# Patient Record
Sex: Male | Born: 1960 | Race: Black or African American | Hispanic: No | Marital: Single | Smoking: Current every day smoker
Health system: Southern US, Community
[De-identification: ages and names within clinical notes are randomized; demographics above are authoritative.]

## PROBLEM LIST (undated history)

## (undated) DIAGNOSIS — B1920 Unspecified viral hepatitis C without hepatic coma: Secondary | ICD-10-CM

## (undated) DIAGNOSIS — D509 Iron deficiency anemia, unspecified: Secondary | ICD-10-CM

## (undated) DIAGNOSIS — K6389 Other specified diseases of intestine: Secondary | ICD-10-CM

## (undated) DIAGNOSIS — Z9289 Personal history of other medical treatment: Secondary | ICD-10-CM

## (undated) DIAGNOSIS — C185 Malignant neoplasm of splenic flexure: Secondary | ICD-10-CM

## (undated) HISTORY — PX: OTHER SURGICAL HISTORY: SHX169

---

## 2010-08-28 ENCOUNTER — Emergency Department (HOSPITAL_COMMUNITY)
Admission: EM | Admit: 2010-08-28 | Discharge: 2010-08-28 | Payer: Self-pay | Source: Home / Self Care | Admitting: Emergency Medicine

## 2011-03-25 ENCOUNTER — Emergency Department (HOSPITAL_COMMUNITY)
Admission: EM | Admit: 2011-03-25 | Discharge: 2011-03-25 | Disposition: A | Discharge status: Home / Self Care | Payer: Self-pay | Attending: Emergency Medicine | Admitting: Emergency Medicine

## 2011-03-25 DIAGNOSIS — S335XXA Sprain of ligaments of lumbar spine, initial encounter: Secondary | ICD-10-CM | POA: Insufficient documentation

## 2011-03-25 DIAGNOSIS — M79609 Pain in unspecified limb: Secondary | ICD-10-CM | POA: Insufficient documentation

## 2011-03-25 DIAGNOSIS — Z981 Arthrodesis status: Secondary | ICD-10-CM | POA: Insufficient documentation

## 2011-03-25 DIAGNOSIS — Y92410 Unspecified street and highway as the place of occurrence of the external cause: Secondary | ICD-10-CM | POA: Insufficient documentation

## 2011-03-25 DIAGNOSIS — S139XXA Sprain of joints and ligaments of unspecified parts of neck, initial encounter: Secondary | ICD-10-CM | POA: Insufficient documentation

## 2015-02-05 DIAGNOSIS — D509 Iron deficiency anemia, unspecified: Secondary | ICD-10-CM

## 2015-02-05 HISTORY — DX: Iron deficiency anemia, unspecified: D50.9

## 2015-02-18 ENCOUNTER — Emergency Department (HOSPITAL_COMMUNITY)
Admission: EM | Admit: 2015-02-18 | Discharge: 2015-02-18 | Disposition: A | Discharge status: Home / Self Care | Payer: Self-pay | Attending: Emergency Medicine | Admitting: Emergency Medicine

## 2015-02-18 ENCOUNTER — Encounter (HOSPITAL_COMMUNITY): Payer: Self-pay | Admitting: *Deleted

## 2015-02-18 DIAGNOSIS — Z720 Tobacco use: Secondary | ICD-10-CM | POA: Insufficient documentation

## 2015-02-18 DIAGNOSIS — R109 Unspecified abdominal pain: Secondary | ICD-10-CM | POA: Insufficient documentation

## 2015-02-18 LAB — CBC
HCT: 28.4 % — ABNORMAL LOW (ref 39.0–52.0)
Hemoglobin: 8.9 g/dL — ABNORMAL LOW (ref 13.0–17.0)
MCH: 23.2 pg — ABNORMAL LOW (ref 26.0–34.0)
MCHC: 31.3 g/dL (ref 30.0–36.0)
MCV: 74.2 fL — ABNORMAL LOW (ref 78.0–100.0)
Platelets: 497 10*3/uL — ABNORMAL HIGH (ref 150–400)
RBC: 3.83 MIL/uL — ABNORMAL LOW (ref 4.22–5.81)
RDW: 18.4 % — AB (ref 11.5–15.5)
WBC: 9.4 10*3/uL (ref 4.0–10.5)

## 2015-02-18 LAB — URINALYSIS, ROUTINE W REFLEX MICROSCOPIC
Bilirubin Urine: NEGATIVE
Glucose, UA: NEGATIVE mg/dL
Hgb urine dipstick: NEGATIVE
KETONES UR: NEGATIVE mg/dL
Leukocytes, UA: NEGATIVE
Nitrite: NEGATIVE
PH: 5.5 (ref 5.0–8.0)
Protein, ur: NEGATIVE mg/dL
Specific Gravity, Urine: 1.026 (ref 1.005–1.030)
UROBILINOGEN UA: 0.2 mg/dL (ref 0.0–1.0)

## 2015-02-18 LAB — COMPREHENSIVE METABOLIC PANEL
ALK PHOS: 74 U/L (ref 38–126)
ALT: 15 U/L — AB (ref 17–63)
AST: 17 U/L (ref 15–41)
Albumin: 4.1 g/dL (ref 3.5–5.0)
Anion gap: 8 (ref 5–15)
BUN: 17 mg/dL (ref 6–20)
CALCIUM: 9 mg/dL (ref 8.9–10.3)
CHLORIDE: 105 mmol/L (ref 101–111)
CO2: 25 mmol/L (ref 22–32)
CREATININE: 1.22 mg/dL (ref 0.61–1.24)
GFR calc Af Amer: >60 mL/min (ref >60)
GFR calc non Af Amer: >60 mL/min (ref >60)
Glucose, Bld: 104 mg/dL — ABNORMAL HIGH (ref 65–99)
POTASSIUM: 4.2 mmol/L (ref 3.5–5.1)
Sodium: 138 mmol/L (ref 135–145)
TOTAL PROTEIN: 7.8 g/dL (ref 6.5–8.1)
Total Bilirubin: 0.3 mg/dL (ref 0.3–1.2)

## 2015-02-18 LAB — I-STAT TROPONIN, ED: Troponin i, poc: 0 ng/mL (ref 0.00–0.08)

## 2015-02-18 LAB — LIPASE, BLOOD: Lipase: 20 U/L — ABNORMAL LOW (ref 22–51)

## 2015-02-18 NOTE — ED Provider Notes (Signed)
CSN: [id number]     Arrival date & time 02/18/15  1345 History   First MD Initiated Contact with Patient 02/18/15 1725     Chief Complaint  Patient presents with  . Abdominal Pain     HPI Patient states his had intermittent lower abdominal pain over the past 2 months.  He states it can last for seconds to minutes.  It feels like a radiating pain coming around bilateral flanks.  Currently he is without any symptoms.  He denies dysuria or urinary frequency.  He denies upper abdominal pain.  Denies nausea and vomiting.  He states many times it is relieved by a bowel movement.  He also reports that he awoke this morning with numbness of his bilateral hands that seems to be improving.  He denies weakness of his arms or legs.  No other significant complaints.  He does not have a primary care physician.  He has a multitude of additional minor complaints but is majority of his concerns today are in regards to his abdominal discomfort which is intermittent and his bilateral hand numbness which is improving   History reviewed. No pertinent past medical history. History reviewed. No pertinent past surgical history. History reviewed. No pertinent family history. History  Substance Use Topics  . Smoking status: Current Every Day Smoker -- 0.50 packs/day for 30 years    Types: Cigarettes  . Smokeless tobacco: Not on file  . Alcohol Use: Yes     Comment: socially    Review of Systems  All other systems reviewed and are negative.     Allergies  Review of patient's allergies indicates no known allergies.  Home Medications   Prior to Admission medications   Medication Sig Start Date End Date Taking? Authorizing Provider  OVER THE COUNTER MEDICATION Take 1 tablet by mouth daily as needed. Laxative (type unknown)   Yes Historical Provider, MD   BP 163/105 mmHg  Pulse 90  Temp(Src) 98.5 F (36.9 C) (Oral)  Resp 18  Ht 5\' 4"  (1.626 m)  Wt 224 lb (101.606 kg)  BMI 38.43 kg/m2  SpO2  100% Physical Exam  Constitutional: He is oriented to person, place, and time. He appears well-developed and well-nourished.  HENT:  Head: Normocephalic and atraumatic.  Eyes: EOM are normal.  Neck: Normal range of motion.  Cardiovascular: Normal rate, regular rhythm, normal heart sounds and intact distal pulses.   Pulmonary/Chest: Effort normal and breath sounds normal. No respiratory distress.  Abdominal: Soft. He exhibits no distension. There is no tenderness.  Musculoskeletal: Normal range of motion.  Neurological: He is alert and oriented to person, place, and time.  Skin: Skin is warm and dry.  Psychiatric: He has a normal mood and affect. Judgment normal.  Nursing note and vitals reviewed.   ED Course  Procedures (including critical care time) Labs Review Labs Reviewed  LIPASE, BLOOD - Abnormal; Notable for the following:    Lipase 20 (*)    All other components within normal limits  COMPREHENSIVE METABOLIC PANEL - Abnormal; Notable for the following:    Glucose, Bld 104 (*)    ALT 15 (*)    All other components within normal limits  CBC - Abnormal; Notable for the following:    RBC 3.83 (*)    Hemoglobin 8.9 (*)    HCT 28.4 (*)    MCV 74.2 (*)    MCH 23.2 (*)    RDW 18.4 (*)    Platelets 497 (*)    All  other components within normal limits  URINALYSIS, ROUTINE W REFLEX MICROSCOPIC (NOT AT [facility])  I-STAT TROPOININ, ED    Imaging Review No results found.   EKG Interpretation   Date/Time:  Thursday February 18 2015 14:10:41 EDT Ventricular Rate:  79 PR Interval:  154 QRS Duration: 80 QT Interval:  381 QTC Calculation: 437 R Axis:   70 Text Interpretation:  Sinus rhythm Baseline wander in lead(s) III No old  tracing to compare Confirmed by Illianna Paschal  MD, Lennette Bihari ([id number]) on 02/18/2015  6:01:16 PM      MDM   Final diagnoses:  Abdominal pain, unspecified abdominal location    Overall the patient is well-appearing.  His vital signs are normal.  His abdominal  exam is completely benign.  His blood work and his urine is without significant abnormality except for his anemia with a hemoglobin of 8.9.  We have no prior hemoglobins to compare this to.  He denies bloody or black stools.  He has never had a colonoscopy.  He is not have primary care physician.  I spoke with my his management team who will give him additional resources regarding primary care physicians in the community.  I stressed to him the importance of ongoing workup of his complaints but I do not believe additional workup is required here in the emergency department today.  He does not need advanced imaging or admission to the hospital.  He will [place] need a primary care physician.  I recommended that he return to the ER for any new or worsening symptoms    Jola Schmidt, MD 02/18/15 [phone]

## 2015-02-18 NOTE — Discharge Instructions (Signed)
°Emergency Department Resource Guide °1) Find a Doctor and Pay Out of Pocket °Although you won't have to find out who is covered by your insurance plan, it is a good idea to ask around and get recommendations. You will then need to call the office and see if the doctor you have chosen will accept you as a new patient and what types of options they offer for patients who are self-pay. Some doctors offer discounts or will set up payment plans for their patients who do not have insurance, but you will need to ask so you aren't surprised when you get to your appointment. ° °2) Contact Your Local Health Department °Not all health departments have doctors that can see patients for sick visits, but many do, so it is worth a call to see if yours does. If you don't know where your local health department is, you can check in your phone book. The CDC also has a tool to help you locate your state's health department, and many state websites also have listings of all of their local health departments. ° °3) Find a Walk-in Clinic °If your illness is not likely to be very severe or complicated, you may want to try a walk in clinic. These are popping up all over the country in pharmacies, drugstores, and shopping centers. They're usually staffed by nurse practitioners or physician assistants that have been trained to treat common illnesses and complaints. They're usually fairly quick and inexpensive. However, if you have serious medical issues or chronic medical problems, these are probably not your best option. ° °No Primary Care Doctor: °- Call [facility] Connect at  832-8000 - they can help you locate a primary care doctor that  accepts your insurance, provides certain services, etc. °- Physician Referral Service- 1-800-533-3463 ° °Chronic Pain Problems: °Organization         Address  Phone   Notes  °[facility]  (336) [phone] Patients need to be referred by their primary care doctor.  ° °Medication  Assistance: °Organization         Address  Phone   Notes  °[place] [facility] [address] °[place], [place] [address] (336) 641-8030 --Must be a resident of [place] °-- Must have NO insurance coverage whatsoever (no Medicaid/ Medicare, etc.) °-- The pt. MUST have a primary care doctor that directs their care regularly and follows them in the community °  °[facility]  (866) 331-1348   °[employer]  (888) 892-1162   ° °Agencies that provide inexpensive medical care: °Organization         Address  Phone   Notes  °[facility]  (336) 832-8035   °[facility]    (336) 832-7272   °[facility] [address] °[place], [place] [address] (336) 832-4777   °[facility] [address], °[place] (336) 271-4999   °[facility]    (336) 373-0678   °[facility]    (336) 272-1050   °[facility] ° [address], [place] Phone:  (336) 832-4444, Fax:  (336) 832-4440 Hours of Operation:  9 am - 6 pm, M-F.  Also accepts Medicaid/Medicare and self-pay.  °[facility] ° [address], [place] Phone: (336) 832-3150, Fax: (336) 832-3151. Hours of Operation:  8:30 am - 5:30 pm, M-F.  Also accepts Medicaid and self-pay.  °[facility] [place] [address], [place] Phone: (336) 878-6027   °[facility] [address], [place], [place] (336)723-1848, Ext. 123 Mondays & Thursdays: 7-9 AM.  First 15 patients are seen on a first come, first serve basis. °  ° °Medicaid-accepting [place] Providers: ° °Organization         Address  Phone   Notes  °[facility] [address], [place] (336) 641-2100 Also accepts self-pay patients.  °[facility] [address], [place] ° (336) 856-9996   °[facility] [address], [place]  (336) 288-8857   °[facility] [address] [place] [address], [place] (336) 299-7000   °Veita Bland [address], [place]  ° (336) 373-1557 Only accepts [place] Access Medicaid patients after they have their name applied to their card.  ° °Self-Pay (no insurance) in [place]: ° °Organization         Address  Phone   Notes  °Sickle Cell Patients, [facility] [address], [place] (336) 832-1970   °[facility] [address], [place] (336) 832-4400   °[facility] ° [address],  (336) 992-4800   °[facility]/Dr. Osei-Bonsu ° [address] [place] [address], [place] or [address], [place] (336) 841-8500 Phone number for both [place] and [place] locations is the same.  °[facility] [address], [place] (336) 299-0000   °[facility] [address] [place] [address], [place] or [address] (336) 852-7530 °(336) 878-2260   °[facility] [address], [place] (336) 350-1642, phone; (336) 294-5005, fax Sees patients 1st and 3rd Saturday of every month.  Must not qualify for public or private insurance (i.e. Medicaid, Medicare, [place] Health Choice, Veterans' Benefits) • Household income should be no more than 200% of the poverty level •The clinic cannot treat you if you are pregnant or think you are pregnant • Sexually transmitted diseases are not treated at the clinic.  ° ° °Dental Care: °Organization         Address  Phone  Notes  °[place] [facility] [address], [place] (336) 641-6152 Accepts children up to age 21 who are enrolled in Medicaid or [place] Health Choice; pregnant women with a Medicaid card; and children who have applied for Medicaid or [place] Health Choice, but were declined, whose parents can pay a reduced fee at time of service.  °[place]  [facility] [place]  [address], [place] (336) 641-7733 Accepts children up to age 21 who are enrolled in Medicaid or [place] Health Choice; pregnant women with a Medicaid card; and children who have applied for Medicaid or [place] Health Choice, but were declined, whose parents can pay a reduced fee at time of service.  °[facility] ° [address],  (336) 641-4533 Patients are seen by appointment only. Walk-ins are not accepted. [facility] will see patients 18 years of age and older. °Monday - Tuesday (8am-5pm) °Most Wednesdays (8:30-5pm) °$30 per visit, cash only  °[facility] ° [address], [place] (336) 641-4533 Patients are seen by appointment only. Walk-ins are not accepted. [facility] will see patients 18 years of age and older. °One   Wednesday Evening (Monthly: Volunteer Based).  $30 per visit, cash only  °[facility]  (919) 537-3737 for adults; Children under age 4, call Graduate Pediatric Dentistry at (919) [phone]. Children aged 4-14, please call (919) [phone] to request a pediatric application. ° Dental services are provided in all areas of dental care including fillings, crowns and bridges, complete and partial dentures, implants, gum treatment, root canals, and extractions. Preventive care is also provided. Treatment is provided to both adults and children. °Patients are selected via a lottery and there is often a waiting list. °  °[facility] [address], °[place] ° (336) 763-8833 [email or web address] °  °[facility] [address], [place], [place] (336)723-1848, Ext. 123 Second and Fourth Thursday of each month, opens at 6:30 AM; Clinic ends at 9 AM.  Patients are seen on a first-come first-served basis, and a limited number are seen during each clinic.  ° °[facility] ° [address], [place], [place] (336) 723-7904    Eligibility Requirements °You must have lived in [place], [place], or [place] counties for at least the last three months. °  You cannot be eligible for state or federal sponsored healthcare insurance, including Veterans Administration, Medicaid, or Medicare. °  You generally cannot be eligible for healthcare insurance through your employer.  °  How to apply: °Eligibility screenings are held every Tuesday and Wednesday afternoon from 1:00 pm until 4:00 pm. You do not need an appointment for the interview!  °[facility] [address], [place], [place] 336-631-2330   °[place] [facility]  336-342-8273   °[place] [facility]  336-703-3100   °[facility] [place] [facility]  336-570-6415   ° °Behavioral Health Resources in the Community: °Intensive Outpatient Programs °Organization         Address  Phone  Notes  °[facility] [address], [place], [place] 336-878-6098   °[facility] [address], [place], [place] 336-832-9800   °[facility] [address], [place], [place] ° 336-882-2125   °[facility] [address],  °[place], [place] 1-800-853-5163 or 336-641-4981   °Substance Abuse Resources °Organization         Address  Phone  Notes  °[facility]  336-882-2125   °[facility]  336-784-9470   °[facility]  336-285-9073   °[facility]  336-845-3988   °Residential & Outpatient Substance Abuse Program  1-800-659-3381   °Psychological Services °Organization         Address  Phone  Notes  °[facility]  336- 832-9600   °[facility]  336- 378-7881   °[facility] [address], [place] 1-800-853-5163 or 336-641-4981   ° °Mobile Crisis Teams °Organization         Address  Phone  Notes  °[facility], Mobile Crisis Care Unit  1-877-626-1772   °Assertive °[facility] ° [address]  [place], [place] 336-834-9664   °Sharon DeEsch [address] °[place]  336-554-5454   ° °Self-Help/Support Groups °Organization         Address  Phone             Notes  °[facility]  - variety of support groups  336- 373-1402 Call for more information  °Narcotics Anonymous (NA), [facility] [address], °[place]   2 meetings at this location  ° °  Residential Treatment Programs °Organization         Address  Phone  Notes  °[facility] [address],    °[place]  1-866-801-8205   °[facility] ° [address], [place], [place] 704-293-8524   °[facility] [address], [place] 336-845-3988 Admissions: 8am-3pm M-F  °[facility] [address]    °[place], [place] 336-841-1104   °[facility] [address], [place], [place] 336-379-7146   °[facility] [address]  °[place], [place] 336-285-9073   °[facility] Intensive Outpatient [address], [place], [place] 336-852-3033   °[facility] [address]  °[place], [place] 1-877-615-2722 or 336-784-9470   °[facility] [address] [place], [place] 336-227-7417 Accepts Medicaid  °[facility] [address]  °[place] 1-800-659-3381 Substance Abuse/Addiction Treatment  ° °[place] Behavioral Health Resources °Organization         Address  Phone  Notes  °[facility]  (888) 581-9988   °Julie Brannon, PhD [address] [place], [place]   (336) 349-5553 or (336) 951-0000   °[facility]   [address] °[place], [place] (336) 349-4454   °[facility] [address], [place], [place] (336) 342-8316 Insurance/Medicaid/sponsorship through [facility]  °[facility] [address]                                    [place], [place] (336) 342-8316 Therapy/tele-psych/case    °[facility] [address]  ° [place], [place] (336) 349-2233    °Dr. Arfeen  (336) 349-4544   °[facility] [place]  [employer] [place] [facility] 1) [address], [place] °2) [address], [place] °3)  [address], [place] (336) 349-3220 °(336) 342-7768 ° °(336) 342-8140   °[place] [facility] (336) 342-1394 or (336) 342-3537 (After Hours)    ° ° °

## 2015-02-18 NOTE — Progress Notes (Signed)
EDCM spoke to patient at bedside. Patient confirms he does not have a pcp or insurance living in [place].  [facility] provided patient with pamphlet to [facility], informed patient of services there and walk in times.  EDCM also provided patient with list of pcps who accept self pay patients, list of discount pharmacies and websites needymeds.org and GoodRX.com for medication assistance, phone number to inquire about the orange card, phone number to inquire about Mediciad, phone number to inquire about the [place], financial resources in the community such as local churches, salvation army, urban ministries, and dental assistance for uninsured patients.  Patient thankful for resources.  No further EDCM needs at this time.

## 2015-02-18 NOTE — ED Notes (Addendum)
Pt reports abd pain x 2 months, pain can radiate to sides and back. Pain 3/10. Big meals make abd pain worse. Diarrhea today.  Bowel movements every 3 days, denies blood in urine. Pt reports syncopal episode on Sunday, pt worked outside that day, pt drank 1 beer. Pt reports he started feeling hot, sat down and says he "went out for a few seconds".   Pt reports he has been having "tingling in his chest" x6 weeks, this morning had tingling in chest, and numbness in fingers. Denies SOB.

## 2015-06-08 DIAGNOSIS — K6389 Other specified diseases of intestine: Secondary | ICD-10-CM

## 2015-06-08 HISTORY — DX: Other specified diseases of intestine: K63.89

## 2015-06-28 ENCOUNTER — Emergency Department (HOSPITAL_COMMUNITY): Payer: Self-pay

## 2015-06-28 ENCOUNTER — Inpatient Hospital Stay (HOSPITAL_COMMUNITY)
Admission: EM | Admit: 2015-06-28 | Discharge: 2015-06-30 | DRG: 375 | Disposition: A | Discharge status: Home / Self Care | Payer: Self-pay | Attending: Oncology | Admitting: Oncology

## 2015-06-28 ENCOUNTER — Encounter (HOSPITAL_COMMUNITY): Payer: Self-pay | Admitting: *Deleted

## 2015-06-28 DIAGNOSIS — Z7289 Other problems related to lifestyle: Secondary | ICD-10-CM

## 2015-06-28 DIAGNOSIS — G2581 Restless legs syndrome: Secondary | ICD-10-CM | POA: Diagnosis present

## 2015-06-28 DIAGNOSIS — K648 Other hemorrhoids: Secondary | ICD-10-CM | POA: Diagnosis present

## 2015-06-28 DIAGNOSIS — K921 Melena: Secondary | ICD-10-CM | POA: Diagnosis present

## 2015-06-28 DIAGNOSIS — D473 Essential (hemorrhagic) thrombocythemia: Secondary | ICD-10-CM | POA: Diagnosis present

## 2015-06-28 DIAGNOSIS — R1319 Other dysphagia: Secondary | ICD-10-CM | POA: Diagnosis present

## 2015-06-28 DIAGNOSIS — K6389 Other specified diseases of intestine: Secondary | ICD-10-CM

## 2015-06-28 DIAGNOSIS — B1920 Unspecified viral hepatitis C without hepatic coma: Secondary | ICD-10-CM | POA: Diagnosis present

## 2015-06-28 DIAGNOSIS — F17210 Nicotine dependence, cigarettes, uncomplicated: Secondary | ICD-10-CM | POA: Diagnosis present

## 2015-06-28 DIAGNOSIS — C189 Malignant neoplasm of colon, unspecified: Secondary | ICD-10-CM

## 2015-06-28 DIAGNOSIS — D649 Anemia, unspecified: Secondary | ICD-10-CM | POA: Diagnosis present

## 2015-06-28 DIAGNOSIS — D500 Iron deficiency anemia secondary to blood loss (chronic): Secondary | ICD-10-CM | POA: Diagnosis present

## 2015-06-28 DIAGNOSIS — R1310 Dysphagia, unspecified: Secondary | ICD-10-CM | POA: Insufficient documentation

## 2015-06-28 DIAGNOSIS — K5900 Constipation, unspecified: Secondary | ICD-10-CM | POA: Diagnosis present

## 2015-06-28 DIAGNOSIS — R599 Enlarged lymph nodes, unspecified: Secondary | ICD-10-CM | POA: Diagnosis present

## 2015-06-28 DIAGNOSIS — C185 Malignant neoplasm of splenic flexure: Principal | ICD-10-CM | POA: Insufficient documentation

## 2015-06-28 HISTORY — DX: Iron deficiency anemia, unspecified: D50.9

## 2015-06-28 HISTORY — DX: Unspecified viral hepatitis C without hepatic coma: B19.20

## 2015-06-28 HISTORY — DX: Other specified diseases of intestine: K63.89

## 2015-06-28 HISTORY — DX: Malignant neoplasm of splenic flexure: C18.5

## 2015-06-28 LAB — CBC
HEMATOCRIT: 21.7 % — AB (ref 39.0–52.0)
Hemoglobin: 6.4 g/dL — CL (ref 13.0–17.0)
MCH: 17.6 pg — ABNORMAL LOW (ref 26.0–34.0)
MCHC: 29.5 g/dL — AB (ref 30.0–36.0)
MCV: 59.8 fL — AB (ref 78.0–100.0)
PLATELETS: 555 10*3/uL — AB (ref 150–400)
RBC: 3.63 MIL/uL — ABNORMAL LOW (ref 4.22–5.81)
RDW: 19.5 % — ABNORMAL HIGH (ref 11.5–15.5)
WBC: 11.3 10*3/uL — AB (ref 4.0–10.5)

## 2015-06-28 LAB — ABO/RH: ABO/RH(D): A POS

## 2015-06-28 LAB — URINALYSIS, ROUTINE W REFLEX MICROSCOPIC
BILIRUBIN URINE: NEGATIVE
GLUCOSE, UA: NEGATIVE mg/dL
HGB URINE DIPSTICK: NEGATIVE
KETONES UR: NEGATIVE mg/dL
Leukocytes, UA: NEGATIVE
Nitrite: NEGATIVE
PH: 7 (ref 5.0–8.0)
PROTEIN: NEGATIVE mg/dL
Specific Gravity, Urine: 1.018 (ref 1.005–1.030)

## 2015-06-28 LAB — COMPREHENSIVE METABOLIC PANEL
ALT: 10 U/L — AB (ref 17–63)
AST: 14 U/L — ABNORMAL LOW (ref 15–41)
Albumin: 3.7 g/dL (ref 3.5–5.0)
Alkaline Phosphatase: 75 U/L (ref 38–126)
Anion gap: 9 (ref 5–15)
BILIRUBIN TOTAL: 0.2 mg/dL — AB (ref 0.3–1.2)
BUN: 11 mg/dL (ref 6–20)
CHLORIDE: 106 mmol/L (ref 101–111)
CO2: 27 mmol/L (ref 22–32)
Calcium: 9.4 mg/dL (ref 8.9–10.3)
Creatinine, Ser: 1.19 mg/dL (ref 0.61–1.24)
GFR calc Af Amer: >60 mL/min (ref >60)
GLUCOSE: 124 mg/dL — AB (ref 65–99)
POTASSIUM: 3.8 mmol/L (ref 3.5–5.1)
Sodium: 142 mmol/L (ref 135–145)
Total Protein: 7 g/dL (ref 6.5–8.1)

## 2015-06-28 LAB — PREPARE RBC (CROSSMATCH)

## 2015-06-28 LAB — POC OCCULT BLOOD, ED: Fecal Occult Bld: POSITIVE — AB

## 2015-06-28 LAB — LIPASE, BLOOD: Lipase: 32 U/L (ref 11–51)

## 2015-06-28 MED ORDER — IOHEXOL 300 MG/ML  SOLN
100.0000 mL | Freq: Once | INTRAMUSCULAR | Status: AC | PRN
  Administered 2015-06-28: 100 mL via INTRAVENOUS

## 2015-06-28 MED ORDER — HYDROMORPHONE HCL 1 MG/ML IJ SOLN
1.0000 mg | Freq: Once | INTRAMUSCULAR | Status: AC
  Administered 2015-06-28: 1 mg via INTRAVENOUS
  Filled 2015-06-28: qty 1

## 2015-06-28 MED ORDER — PANTOPRAZOLE SODIUM 40 MG IV SOLR
40.0000 mg | Freq: Once | INTRAVENOUS | Status: AC
  Administered 2015-06-28: 40 mg via INTRAVENOUS
  Filled 2015-06-28: qty 40

## 2015-06-28 NOTE — ED Notes (Signed)
Pt eating candy in triage.

## 2015-06-28 NOTE — ED Notes (Signed)
Lab at the bedside 

## 2015-06-28 NOTE — ED Notes (Signed)
Pt states that he's having stomach issues. States some pain and "all kinds of crap." states this has been going on since the beginning of the year. States his energy is low especially when his stomach hurts. States his mouth gets dry. States he is constipated.

## 2015-06-28 NOTE — ED Provider Notes (Signed)
CSN: [id number]     Arrival date & time 06/28/15  1806 History   First MD Initiated Contact with Patient 06/28/15 2016     Chief Complaint  Patient presents with  . Abdominal Pain     (Consider location/radiation/quality/duration/timing/severity/associated sxs/prior Treatment) HPI Patient states he has had abdominal pain for several weeks. He describes it is predominantly lower. It comes and goes in severity. He states he has noted blood in his stool for a number of weeks as well. He reports sometimes is red in appearance. He denies any vomiting. No fevers. He reports he has had low blood count in the past but has never had an endoscopy 2 determine where he is losing blood. Past Medical History  Diagnosis Date  . Hepatitis C    History reviewed. No pertinent past surgical history. No family history on file. Social History  Substance Use Topics  . Smoking status: Current Every Day Smoker -- 0.50 packs/day for 30 years    Types: Cigarettes  . Smokeless tobacco: None  . Alcohol Use: Yes     Comment: socially    Review of Systems  10 Systems reviewed and are negative for acute change except as noted in the HPI.   Allergies  Review of patient's allergies indicates no known allergies.  Home Medications   Prior to Admission medications   Medication Sig Start Date End Date Taking? Authorizing Provider  acetaminophen (TYLENOL) 325 MG tablet Take 650 mg by mouth every 6 (six) hours as needed for mild pain or moderate pain.   Yes Historical Provider, MD   BP 136/86 mmHg  Pulse 86  Temp(Src) 98.5 F (36.9 C) (Oral)  Resp 12  Ht 6\' 3"  (1.905 m)  Wt 180 lb (81.647 kg)  BMI 22.50 kg/m2  SpO2 100% Physical Exam  Constitutional: He is oriented to person, place, and time. He appears well-developed and well-nourished.  HENT:  Head: Normocephalic and atraumatic.  Eyes: EOM are normal. Pupils are equal, round, and reactive to light.  Neck: Neck supple.  Cardiovascular: Normal rate,  regular rhythm, normal heart sounds and intact distal pulses.   Pulmonary/Chest: Effort normal and breath sounds normal.  Abdominal: Soft. Bowel sounds are normal. He exhibits no distension. There is tenderness.  Moderate lower abdominal tenderness without guarding.  Genitourinary:  Rectal examination, hemorrhoid is present. There is red mucousy stool in the vault.  Musculoskeletal: Normal range of motion. He exhibits no edema.  Neurological: He is alert and oriented to person, place, and time. He has normal strength. Coordination normal. GCS eye subscore is 4. GCS verbal subscore is 5. GCS motor subscore is 6.  Skin: Skin is warm, dry and intact.  Psychiatric: He has a normal mood and affect.    ED Course  Procedures (including critical care time) Labs Review Labs Reviewed  COMPREHENSIVE METABOLIC PANEL - Abnormal; Notable for the following:    Glucose, Bld 124 (*)    AST 14 (*)    ALT 10 (*)    Total Bilirubin 0.2 (*)    All other components within normal limits  CBC - Abnormal; Notable for the following:    WBC 11.3 (*)    RBC 3.63 (*)    Hemoglobin 6.4 (*)    HCT 21.7 (*)    MCV 59.8 (*)    MCH 17.6 (*)    MCHC 29.5 (*)    RDW 19.5 (*)    Platelets 555 (*)    All other components within normal limits  POC OCCULT BLOOD, ED - Abnormal; Notable for the following:    Fecal Occult Bld POSITIVE (*)    All other components within normal limits  LIPASE, BLOOD  URINALYSIS, ROUTINE W REFLEX MICROSCOPIC (NOT AT [facility])  TYPE AND SCREEN  PREPARE RBC (CROSSMATCH)  ABO/RH    Imaging Review Ct Abdomen Pelvis W Contrast  06/28/2015  CLINICAL DATA:  Chronic lower abdominal pain and irregular constipation. Initial encounter. EXAM: CT ABDOMEN AND PELVIS WITH CONTRAST TECHNIQUE: Multidetector CT imaging of the abdomen and pelvis was performed using the standard protocol following bolus administration of intravenous contrast. CONTRAST:  161mL OMNIPAQUE IOHEXOL 300 MG/ML  SOLN COMPARISON:   None. FINDINGS: Minimal left basilar atelectasis is noted. The liver and spleen are unremarkable in appearance. The gallbladder is within normal limits. The pancreas and adrenal glands are unremarkable. Mild nonspecific perinephric stranding is noted bilaterally. The kidneys are otherwise unremarkable. No renal or ureteral stones are seen. There is no evidence of hydronephrosis. No free fluid is identified. The small bowel is unremarkable in appearance. The stomach is within normal limits. No acute vascular abnormalities are seen. Minimal calcification is noted at the distal abdominal aorta. The appendix is normal in caliber and contains air, without evidence of appendicitis. There is segmental circumferential wall thickening noted along the splenic flexure of the colon, measuring approximately 6.9 x 4.6 cm, with surrounding soft tissue inflammation and trace fluid, and mild adjacent nodularity measuring up to 1.1 cm. A few mesenteric nodes are seen, measuring up to 6 mm in short axis. This is compatible with primary colonic malignancy. A nonspecific 1.0 cm nodule is noted lateral to the spleen. This may simply reflect a splenule. The bladder is moderately distended and grossly unremarkable in appearance. The prostate remains normal in size. No inguinal lymphadenopathy is seen. No acute osseous abnormalities are identified. Vacuum phenomenon is noted at L5-S1. IMPRESSION: 1. 6.9 x 4.6 cm mass noted at the splenic flexure of the colon, with circumferential wall thickening, and surrounding soft tissue inflammation and trace fluid, compatible with prior right colonic malignancy. Mild adjacent nodularity measures up to 1.1 cm. Few mesenteric nodes seen, measuring up to 6 mm in short axis, likely reflecting nodal spread of disease. 2. Nonspecific 1.0 cm nodule noted lateral to the spleen. This may simply reflect a splenule. Electronically Signed   By: Garald Balding M.D.   On: 06/28/2015 23:46   I have personally  reviewed and evaluated these images and lab results as part of my medical decision-making.   EKG Interpretation None      MDM   Final diagnoses:  Gastrointestinal hemorrhage with melena  Malignant neoplasm of colon, unspecified part of colon [facility])   Patient presents with increasing abdominal pain and bloody stool. CT scan shows the neoplasm suspicious for malignancy. He also has critical anemia 6.4. Transfusion will be initiated and admission to medical service for ongoing management. She is nontoxic. He is not acutely in respiratory distress. Mental status is clear and he is afebrile.    Charlesetta Shanks, MD 06/29/15 [phone]

## 2015-06-28 NOTE — ED Notes (Signed)
Critical lab result- hgb 6.4, changed to acuity 2

## 2015-06-28 NOTE — ED Notes (Signed)
MD at bedside. 

## 2015-06-28 NOTE — ED Notes (Signed)
Pt returned from CT °

## 2015-06-28 NOTE — ED Notes (Signed)
Patient transported to CT 

## 2015-06-29 ENCOUNTER — Encounter (HOSPITAL_COMMUNITY): Payer: Self-pay | Admitting: Physician Assistant

## 2015-06-29 ENCOUNTER — Inpatient Hospital Stay (HOSPITAL_COMMUNITY): Payer: Self-pay

## 2015-06-29 DIAGNOSIS — K6389 Other specified diseases of intestine: Secondary | ICD-10-CM

## 2015-06-29 DIAGNOSIS — R1310 Dysphagia, unspecified: Secondary | ICD-10-CM

## 2015-06-29 DIAGNOSIS — R935 Abnormal findings on diagnostic imaging of other abdominal regions, including retroperitoneum: Secondary | ICD-10-CM

## 2015-06-29 DIAGNOSIS — D500 Iron deficiency anemia secondary to blood loss (chronic): Secondary | ICD-10-CM | POA: Diagnosis present

## 2015-06-29 DIAGNOSIS — D509 Iron deficiency anemia, unspecified: Secondary | ICD-10-CM

## 2015-06-29 DIAGNOSIS — B1920 Unspecified viral hepatitis C without hepatic coma: Secondary | ICD-10-CM | POA: Diagnosis present

## 2015-06-29 DIAGNOSIS — F17210 Nicotine dependence, cigarettes, uncomplicated: Secondary | ICD-10-CM | POA: Diagnosis present

## 2015-06-29 DIAGNOSIS — D649 Anemia, unspecified: Secondary | ICD-10-CM | POA: Diagnosis present

## 2015-06-29 LAB — FERRITIN: Ferritin: 6 ng/mL — ABNORMAL LOW (ref 24–336)

## 2015-06-29 LAB — RETICULOCYTES
RBC.: 4.37 MIL/uL (ref 4.22–5.81)
RETIC COUNT ABSOLUTE: 21.9 10*3/uL (ref 19.0–186.0)
Retic Ct Pct: 0.5 % (ref 0.4–3.1)

## 2015-06-29 LAB — CBC
HCT: 30 % — ABNORMAL LOW (ref 39.0–52.0)
Hemoglobin: 9.3 g/dL — ABNORMAL LOW (ref 13.0–17.0)
MCH: 20.9 pg — ABNORMAL LOW (ref 26.0–34.0)
MCHC: 31 g/dL (ref 30.0–36.0)
MCV: 67.4 fL — ABNORMAL LOW (ref 78.0–100.0)
PLATELETS: 559 10*3/uL — AB (ref 150–400)
RBC: 4.45 MIL/uL (ref 4.22–5.81)
RDW: 24.4 % — AB (ref 11.5–15.5)
WBC: 9.3 10*3/uL (ref 4.0–10.5)

## 2015-06-29 LAB — RAPID URINE DRUG SCREEN, HOSP PERFORMED
AMPHETAMINES: NOT DETECTED
BARBITURATES: NOT DETECTED
BENZODIAZEPINES: NOT DETECTED
COCAINE: NOT DETECTED
Opiates: NOT DETECTED
TETRAHYDROCANNABINOL: NOT DETECTED

## 2015-06-29 LAB — IRON AND TIBC
Iron: 224 ug/dL — ABNORMAL HIGH (ref 45–182)
Saturation Ratios: 42 % — ABNORMAL HIGH (ref 17.9–39.5)
TIBC: 538 ug/dL — ABNORMAL HIGH (ref 250–450)
UIBC: 314 ug/dL

## 2015-06-29 LAB — VITAMIN B12: VITAMIN B 12: 212 pg/mL (ref 180–914)

## 2015-06-29 LAB — FOLATE: FOLATE: 13.3 ng/mL (ref >5.9)

## 2015-06-29 MED ORDER — BISACODYL 5 MG PO TBEC: 5.0000 mg | DELAYED_RELEASE_TABLET | Freq: Once | ORAL | Status: DC

## 2015-06-29 MED ORDER — PEG-KCL-NACL-NASULF-NA ASC-C 100 G PO SOLR
0.5000 | Freq: Once | ORAL | Status: AC
  Administered 2015-06-30: 100 g via ORAL

## 2015-06-29 MED ORDER — PEG-KCL-NACL-NASULF-NA ASC-C 100 G PO SOLR
0.5000 | Freq: Once | ORAL | Status: AC
  Administered 2015-06-29: 100 g via ORAL
  Filled 2015-06-29: qty 1

## 2015-06-29 MED ORDER — ONDANSETRON HCL 4 MG PO TABS: 4.0000 mg | ORAL_TABLET | Freq: Four times a day (QID) | ORAL | Status: DC | PRN

## 2015-06-29 MED ORDER — ACETAMINOPHEN 325 MG PO TABS
650.0000 mg | ORAL_TABLET | Freq: Four times a day (QID) | ORAL | Status: DC | PRN
  Administered 2015-06-29: 650 mg via ORAL
  Filled 2015-06-29: qty 2

## 2015-06-29 MED ORDER — ACETAMINOPHEN 650 MG RE SUPP: 650.0000 mg | Freq: Four times a day (QID) | RECTAL | Status: DC | PRN

## 2015-06-29 MED ORDER — TRAMADOL HCL 50 MG PO TABS
50.0000 mg | ORAL_TABLET | Freq: Four times a day (QID) | ORAL | Status: DC | PRN
  Administered 2015-06-29: 50 mg via ORAL
  Filled 2015-06-29: qty 1

## 2015-06-29 MED ORDER — ACETAMINOPHEN 325 MG PO TABS: 650.0000 mg | ORAL_TABLET | Freq: Four times a day (QID) | ORAL | Status: DC | PRN

## 2015-06-29 MED ORDER — INFLUENZA VAC SPLIT QUAD 0.5 ML IM SUSY
0.5000 mL | PREFILLED_SYRINGE | INTRAMUSCULAR | Status: AC
  Administered 2015-06-30: 0.5 mL via INTRAMUSCULAR

## 2015-06-29 MED ORDER — BISACODYL 5 MG PO TBEC
5.0000 mg | DELAYED_RELEASE_TABLET | Freq: Once | ORAL | Status: AC
  Administered 2015-06-29: 5 mg via ORAL
  Filled 2015-06-29: qty 1

## 2015-06-29 MED ORDER — OXYCODONE HCL 5 MG PO TABS
5.0000 mg | ORAL_TABLET | Freq: Once | ORAL | Status: AC
  Administered 2015-06-29: 5 mg via ORAL
  Filled 2015-06-29: qty 1

## 2015-06-29 MED ORDER — ONDANSETRON HCL 4 MG/2ML IJ SOLN
4.0000 mg | Freq: Four times a day (QID) | INTRAMUSCULAR | Status: DC | PRN
  Administered 2015-06-30: 4 mg via INTRAVENOUS

## 2015-06-29 MED ORDER — METOCLOPRAMIDE HCL 5 MG/ML IJ SOLN
10.0000 mg | Freq: Once | INTRAMUSCULAR | Status: AC
  Administered 2015-06-30: 10 mg via INTRAVENOUS
  Filled 2015-06-29: qty 2

## 2015-06-29 MED ORDER — METOCLOPRAMIDE HCL 5 MG/ML IJ SOLN
10.0000 mg | Freq: Once | INTRAMUSCULAR | Status: AC
  Administered 2015-06-29: 10 mg via INTRAVENOUS
  Filled 2015-06-29: qty 2

## 2015-06-29 MED ORDER — PNEUMOCOCCAL VAC POLYVALENT 25 MCG/0.5ML IJ INJ
0.5000 mL | INJECTION | INTRAMUSCULAR | Status: AC
  Administered 2015-06-30: 0.5 mL via INTRAMUSCULAR
  Filled 2015-06-29: qty 0.5

## 2015-06-29 MED ORDER — PEG-KCL-NACL-NASULF-NA ASC-C 100 G PO SOLR: 1.0000 | Freq: Once | ORAL | Status: DC

## 2015-06-29 NOTE — H&P (Signed)
Date: 06/29/2015               Patient Name:  Edgar [name] MRN: [id number]  DOB: 25-Jun-1961 Age / Sex: 54 y.o., male   PCP: No Pcp Per Patient         Medical Service: Internal Medicine Teaching Service         Attending Physician: Dr. Beryle Beams, MD    First Contact: Dr. Zada Finders Pager: [phone]  Second Contact: Dr. Dellia Nims Pager: [phone]       After Hours (After 5p/  First Contact Pager: [phone]  weekends / holidays): Second Contact Pager: [phone]   Chief Complaint: "My belly's been hurting."  History of Present Illness: Mr. Quale is a 54 year old African American man with no known medical history who does not regularly see a doctor, presenting with lower abdominal pain, constipation, pencil thin stools, bright red blood per rectum, melena, lightheadedness, odynophagia, and restless legs while sleeping.  His lower abdominal pain started 8 months ago; it is excruciatingly sharp, wraps around his back, and comes and goes randomly. It's been more and more frequent since the onset, and eventually became unbearable so he decided to come into the emergency department. 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 He's lost about 10 pounds in the last 3 months. He's also noted bright red blood per rectum and very dark stools which he thought were hemorrhoids, and his stools have been very dark. Additionally, he describes a feeling of food getting stuck in his chest since these symptoms began, which he attributes to mouth-breathing which dries out his mouth and food gets stuck. He denies any gastroesophageal reflux symptoms. His restless legs while sleeping have been going on since this time as well which has been very bothersome. Besides these symptoms, he denies any cough, hemoptysis, difficulty urinating, focal back pain, or other  complaints.  He came to the emergency department 4 months ago with these same symptoms and was found to have a hemoglobin of 8.4. He was discharged from the ED with resources to establish with a primary care doctor but he never followed up.   Prior to 4 months ago, he had not seen a doctor in years, and only for acute issues. He's never had a colonoscopy. He smokes about 1 pack of cigarettes per week for the last 20 years, and drinks about half a pint of liquor for 10 years but quit 8 months ago when his symptoms started. He's never used IV drugs or any other type of drugs before. There is no history of colorectal cancer or any other type of cancer in his family.  In the emergency department, he was tachycardic to 102, but slightly normotensive at 130/80, saturating 100% on room air. His basic labs showed a hemoglobin of 6.4 with an MCV of 60, and thrombophilia of 555. An abdominal CT showed a 7x5cm mass in the splenic flexure of the colon with circumferential wall thickening and a few mesenteric nodes, likely reflecting nodal spread of disease. He was transfused 3U of PRBCs and admitted to IMTS.  Meds: No current facility-administered medications for this encounter.   Current Outpatient Prescriptions  Medication Sig Dispense Refill  . acetaminophen (TYLENOL) 325 MG tablet Take 650 mg by mouth every 6 (six) hours as needed for mild  pain or moderate pain.      Allergies: Allergies as of 06/28/2015  . (No Known Allergies)   Past Medical History  Diagnosis Date  . Hepatitis C    History reviewed. No pertinent past surgical history. No family history on file. Social History   Social History  . Marital Status: Married    Spouse Name: N/A  . Number of Children: N/A  . Years of Education: N/A   Occupational History  . Not on file.   Social History Main Topics  . Smoking status: Current Every Day Smoker -- 0.50 packs/day for 30 years    Types: Cigarettes  . Smokeless tobacco: Not on  file  . Alcohol Use: Yes     Comment: socially  . Drug Use: Not on file  . Sexual Activity: Not on file   Other Topics Concern  . Not on file   Social History Narrative    Review of Systems  Constitutional: Positive for weight loss and malaise/fatigue. Negative for fever, chills and diaphoresis.  HENT: Negative for sore throat.   Eyes: Negative for blurred vision and double vision.  Respiratory: Positive for shortness of breath. Negative for cough, hemoptysis, sputum production and wheezing.   Cardiovascular: Negative for chest pain, palpitations, orthopnea, claudication and leg swelling.  Gastrointestinal: Positive for abdominal pain, constipation, blood in stool and melena. Negative for heartburn, nausea, vomiting and diarrhea.  Genitourinary: Positive for flank pain. Negative for urgency.  Musculoskeletal: Negative for myalgias and neck pain.  Skin: Negative for itching and rash.  Neurological: Positive for weakness. Negative for dizziness, sensory change, loss of consciousness and headaches.  Psychiatric/Behavioral: Negative for depression and substance abuse. The patient has insomnia.     Physical Exam: Blood pressure 168/86, pulse 86, temperature 98.3 F (36.8 C), temperature source Oral, resp. rate 14, height 6\' 3"  (1.905 m), weight 81.647 kg (180 lb), SpO2 100 %.  General: African American man resting in bed comfortably, appropriately conversational, slightly anxious HEENT: pale conjunctivae, but no scleral icterus, extra-ocular muscles intact, oropharynx without lesions Cardiac: tachycardic but regular rhythm, no rubs, murmurs or gallops Pulm: breathing well, clear to auscultation bilaterally Abd: bowel sounds normal, soft, nondistended, non-tender Ext: warm and well perfused, without pedal edema Lymph: no cervical, supraclavicular, or inguinal lymphadenopathy Skin: lichenified plaques on extensor knees, pale nail beds, with subtle clubbing Neuro: alert and oriented X3,  cranial nerves II-XII grossly intact, moving all extremities well  Lab results: Basic Metabolic Panel:  Recent Labs  06/28/15 1819  NA 142  K 3.8  CL 106  CO2 27  GLUCOSE 124*  BUN 11  CREATININE 1.19  CALCIUM 9.4   Liver Function Tests:  Recent Labs  06/28/15 1819  AST 14*  ALT 10*  ALKPHOS 75  BILITOT 0.2*  PROT 7.0  ALBUMIN 3.7    Recent Labs  06/28/15 1819  LIPASE 32   CBC:  Recent Labs  06/28/15 1819  WBC 11.3*  HGB 6.4*  HCT 21.7*  MCV 59.8*  PLT 555*   Urinalysis:  Recent Labs  06/28/15 1936  COLORURINE YELLOW  LABSPEC 1.018  PHURINE 7.0  GLUCOSEU NEGATIVE  HGBUR NEGATIVE  BILIRUBINUR NEGATIVE  KETONESUR NEGATIVE  PROTEINUR NEGATIVE  NITRITE NEGATIVE  LEUKOCYTESUR NEGATIVE   Imaging results:  Ct Abdomen Pelvis W Contrast  06/28/2015  CLINICAL DATA:  Chronic lower abdominal pain and irregular constipation. Initial encounter. EXAM: CT ABDOMEN AND PELVIS WITH CONTRAST TECHNIQUE: Multidetector CT imaging of the abdomen and pelvis was performed  using the standard protocol following bolus administration of intravenous contrast. CONTRAST:  148mL OMNIPAQUE IOHEXOL 300 MG/ML  SOLN COMPARISON:  None. FINDINGS: Minimal left basilar atelectasis is noted. The liver and spleen are unremarkable in appearance. The gallbladder is within normal limits. The pancreas and adrenal glands are unremarkable. Mild nonspecific perinephric stranding is noted bilaterally. The kidneys are otherwise unremarkable. No renal or ureteral stones are seen. There is no evidence of hydronephrosis. No free fluid is identified. The small bowel is unremarkable in appearance. The stomach is within normal limits. No acute vascular abnormalities are seen. Minimal calcification is noted at the distal abdominal aorta. The appendix is normal in caliber and contains air, without evidence of appendicitis. There is segmental circumferential wall thickening noted along the splenic flexure of the  colon, measuring approximately 6.9 x 4.6 cm, with surrounding soft tissue inflammation and trace fluid, and mild adjacent nodularity measuring up to 1.1 cm. A few mesenteric nodes are seen, measuring up to 6 mm in short axis. This is compatible with primary colonic malignancy. A nonspecific 1.0 cm nodule is noted lateral to the spleen. This may simply reflect a splenule. The bladder is moderately distended and grossly unremarkable in appearance. The prostate remains normal in size. No inguinal lymphadenopathy is seen. No acute osseous abnormalities are identified. Vacuum phenomenon is noted at L5-S1. IMPRESSION: 1. 6.9 x 4.6 cm mass noted at the splenic flexure of the colon, with circumferential wall thickening, and surrounding soft tissue inflammation and trace fluid, compatible with prior right colonic malignancy. Mild adjacent nodularity measures up to 1.1 cm. Few mesenteric nodes seen, measuring up to 6 mm in short axis, likely reflecting nodal spread of disease. 2. Nonspecific 1.0 cm nodule noted lateral to the spleen. This may simply reflect a splenule. Electronically Signed   By: Garald Balding M.D.   On: 06/28/2015 23:46   Assessment & Plan by Problem: Mr. Lubeck is a 54 year old African American man with no known medical history presenting with lower abdominal pain, pencil thin stools, and anemia, found have a mass at the splenic flexure with nodal spread most concerning for colorectal cancer which seems to be source of all of his problems. He was transfused 3 units of PRBCs in the emergency department which he tolerated well. Going forward, outpatient follow-up for colonoscopy and tissue diagnosis will be crucial for this gentleman. Regarding his odynophagia, this is perplexing to me but seems to be legitimate enough to warrant endoscopy. Fortunately he'll be plugged into gastroenterology's care. His restless legs are likely from his chronic iron deficiency anemia and hopefully will resolve along with  his anemia. He mentioned he was diagnosed with hepatitis C but did not get treatment; we'll check a hepatitis panel and HIV.  Symptomatic anemia from colonic mass: Per above; he was transfused and will need further work-up for what appears to likely be colorectal cancer. -Transfused 3U PRBCs -Gastroenterology consult in the morning -Tramadol for severe pain -Anemia panel  Odynophagia: Per above, I don't know what's causing this, but an endoscopy may be warranted to further evaluate. -Gastroenterology consult in the morning  Restless legs: Per above, I think this is related to his iron deficiency anemia. -Transfused PRBCs  Self-reported hepatitis C without signs of cirrhosis on CT: Per above, we'll check a hepatitis panel. -Follow-up hepatitis panel  Dispo: Disposition is deferred at this time, awaiting improvement of current medical problems.  The patient does not have a current PCP (No Pcp Per Patient) and does need  an [facility] hospital follow-up appointment after discharge.  The patient does not know have transportation limitations that hinder transportation to clinic appointments.  Signed: Loleta Chance, MD 06/29/2015, 1:14 AM

## 2015-06-29 NOTE — Consult Note (Signed)
[facility] Consult: 1:40 PM 06/29/2015  LOS: 0 days    Referring Provider: Dr Posey Pronto  Primary Care Physician:  none Primary Gastroenterologist:  unassigned    Reason for Consultation:  Mass at splenic flexure, microcytic anemia.    HPI: Edgar [name] is a 54 y.o. male.  Diagnosed with hepatitis C in the 1990s. At that time he had acute jaundice, pruritus. Hasn't had any known liver disease consequent to this but he doesn't see a doctor regularly. Status post skin graft to injury of the right thumb.  Seen 02/2015 in ED for 2 months of abd pain, altered bowel habits. Labs notable for hemoglobin 8.9, MCV 74. Since he looked well the ED physician did not feel it was necessary to obtain imaging. However EGD M.D. plan was to have patient follow-up with a PMD, but the patient never did this.  Patient has had constipation for several months he moves his bowels every 1-2 weeks. He has seen intermittent small amounts of blood with stools. He has lost about 10 pounds in the last 12 months. He says that for the constipation prune juice is the most effective remedy but OTC laxatives are not helpful. The pain is in the mid lower abdomen and radiates into both right and left lower quadrants and sometimes when severe will radiate into his back. It has become more constant and severe and this is why he presented to the emergency room yesterday. Tylenol is minimally effective in controlling the pain. Having a bowel movement does not improve the pain. Several months ago patient tended to be diarrhea prone but in the last few months the constipation has taken over and caliber of his stool is smaller He also has noticeable dyspnea on exertion in the last few weeks. Has not been dizzy or syncopal however. He describes intermittent  dysphagia to both solids and liquids and has had regurgitation of swallowed food material but no nausea. He has early satiety.  EGD evaluation revealed: Hgb 6.4, MCV 59.  Hgb 02/18/15 was 8.9, MCV 74.  FOBT +.   CT demonstrates mass at splenic flexure with adjacent nodularity, adenopathy c/w cancer.    S/p PRBCs x 3, repeat CBC ordered for tomorrow morning..   No family history of colon cancer or polyps.  She used to consume alcohol, sometimes to excess. However for several months he has not been drinking as he thought this might be contributing to his GI symptoms.      Past Medical History  Diagnosis Date  . Hepatitis C 1990s  . Microcytic anemia 02/2015  . Mass of colon 06/2015    Past Surgical History  Procedure Laterality Date  . Skin graft to repair injury to right thumb.      Prior to Admission medications   Medication Sig Start Date End Date Taking? Authorizing Provider  acetaminophen (TYLENOL) 325 MG tablet Take 650 mg by mouth every 6 (six) hours as needed for mild pain or moderate pain.   Yes Historical Provider, MD    Scheduled Meds: . [START ON 06/30/2015]  Influenza vac split quadrivalent PF  0.5 mL Intramuscular Tomorrow-1000  . [START ON 06/30/2015] pneumococcal 23 valent vaccine  0.5 mL Intramuscular Tomorrow-1000   Infusions:   PRN Meds: acetaminophen **OR** acetaminophen, ondansetron **OR** ondansetron (ZOFRAN) IV, traMADol   Allergies as of 06/28/2015  . (No Known Allergies)    No family history on file.  Social History   Social History  . Marital Status: Married    Spouse Name: N/A  . Number of Children: N/A  . Years of Education: N/A   Occupational History  . Cabin [employer]    Social History Main Topics  . Smoking status: Current Every Day Smoker -- 0.50 packs/day for 30 years    Types: Cigarettes  . Smokeless tobacco: Not on file  . Alcohol Use: Yes     Comment: socially  . Drug Use: Not on file  . Sexual Activity: Not on file    Other Topics Concern  . Not on file   Social History Narrative    REVIEW OF SYSTEMS: Constitutional:  Per HPI ENT:  No nose bleeds Pulm:  DOE.  No cough CV:  No palpitations, no LE edema.  GU:  No hematuria, no frequency GI:  Per HPi Heme:  Denies excessive bleeding or bruising. Patient unaware of previous anemia.   Transfusions:  No transfusions before these last 24 hours. Neuro:  No headaches, no peripheral tingling or numbness Derm:  No itching, no rash or sores.  Endocrine:  No sweats or chills.  No polyuria or dysuria Immunization:  Did not inquire as to immunizations status. Travel:  None beyond local counties in last few months.    PHYSICAL EXAM: Vital signs in last 24 hours: Filed Vitals:   06/29/15 0935 06/29/15 1157  BP: 130/74 131/82  Pulse: 77 79  Temp: 98.6 F (37 C) 98.5 F (36.9 C)  Resp: 18 18   Wt Readings from Last 3 Encounters:  06/29/15 80.287 kg (177 lb)  02/18/15 101.606 kg (224 lb)    General: Pleasant, well-appearing, non-cachectic, comfortable AAM. Head:  No facial asymmetry or swelling.  Eyes:  No scleral icterus.  Conjunctiva is pale. EOMI Ears:  Not hard of hearing  Nose:  No discharge or congestion Mouth:  Clear, moist, pink oral mucosa. Neck:  No masses, no TMG. No JVD. Lungs:  CTA bil.  No labored breathing or cough. Heart: RRR. No MRG. S1/S2 audible. Abdomen:  Soft.NT, ND.  No masses. No hepatosplenomegaly..   Rectal: Deferred.   Musc/Skeltl: No joint erythema, swelling or gross deformities Extremities:  No CCE. Pedal pulses 3+ bilaterally.  Neurologic:  Oriented 3. Fully alert. No tremors. No limb weakness. Skin:  No sores or rashes. Tattoos:  None observed   Psych:  Pleasant, calm, not depressed.  Intake/Output from previous day:   Intake/Output this shift: Total I/O In: 455 [P.O.:120; Blood:335] Out: -   LAB RESULTS:  Recent Labs  06/28/15 1819  WBC 11.3*  HGB 6.4*  HCT 21.7*  PLT 555*   BMET Lab Results   Component Value Date   NA 142 06/28/2015   NA 138 02/18/2015   K 3.8 06/28/2015   K 4.2 02/18/2015   CL 106 06/28/2015   CL 105 02/18/2015   CO2 27 06/28/2015   CO2 25 02/18/2015   GLUCOSE 124* 06/28/2015   GLUCOSE 104* 02/18/2015   BUN 11 06/28/2015   BUN 17 02/18/2015   CREATININE 1.19 06/28/2015   CREATININE 1.22 02/18/2015   CALCIUM 9.4 06/28/2015  CALCIUM 9.0 02/18/2015   LFT  Recent Labs  06/28/15 1819  PROT 7.0  ALBUMIN 3.7  AST 14*  ALT 10*  ALKPHOS 75  BILITOT 0.2*   PT/INR No results found for: INR, PROTIME Hepatitis Panel No results for input(s): HEPBSAG, HCVAB, HEPAIGM, HEPBIGM in the last 72 hours. C-Diff No components found for: CDIFF Lipase     Component Value Date/Time   LIPASE 32 06/28/2015 1819    Drugs of Abuse     Component Value Date/Time   LABOPIA NONE DETECTED 06/28/2015 1936   COCAINSCRNUR NONE DETECTED 06/28/2015 1936   LABBENZ NONE DETECTED 06/28/2015 1936   AMPHETMU NONE DETECTED 06/28/2015 1936   THCU NONE DETECTED 06/28/2015 1936   LABBARB NONE DETECTED 06/28/2015 1936     RADIOLOGY STUDIES: Dg Chest 2 View  06/29/2015  CLINICAL DATA:  Week.  History of colon mass. EXAM: CHEST  2 VIEW COMPARISON:  None. FINDINGS: The heart size and mediastinal contours are within normal limits. Both lungs are clear. The visualized skeletal structures are unremarkable. IMPRESSION: No active cardiopulmonary disease. Electronically Signed   By: Kerby Moors M.D.   On: 06/29/2015 13:26   Ct Abdomen Pelvis W Contrast  06/28/2015  CLINICAL DATA:  Chronic lower abdominal pain and irregular constipation. Initial encounter. EXAM: CT ABDOMEN AND PELVIS WITH CONTRAST TECHNIQUE: Multidetector CT imaging of the abdomen and pelvis was performed using the standard protocol following bolus administration of intravenous contrast. CONTRAST:  117mL OMNIPAQUE IOHEXOL 300 MG/ML  SOLN COMPARISON:  None. FINDINGS: Minimal left basilar atelectasis is noted. The  liver and spleen are unremarkable in appearance. The gallbladder is within normal limits. The pancreas and adrenal glands are unremarkable. Mild nonspecific perinephric stranding is noted bilaterally. The kidneys are otherwise unremarkable. No renal or ureteral stones are seen. There is no evidence of hydronephrosis. No free fluid is identified. The small bowel is unremarkable in appearance. The stomach is within normal limits. No acute vascular abnormalities are seen. Minimal calcification is noted at the distal abdominal aorta. The appendix is normal in caliber and contains air, without evidence of appendicitis. There is segmental circumferential wall thickening noted along the splenic flexure of the colon, measuring approximately 6.9 x 4.6 cm, with surrounding soft tissue inflammation and trace fluid, and mild adjacent nodularity measuring up to 1.1 cm. A few mesenteric nodes are seen, measuring up to 6 mm in short axis. This is compatible with primary colonic malignancy. A nonspecific 1.0 cm nodule is noted lateral to the spleen. This may simply reflect a splenule. The bladder is moderately distended and grossly unremarkable in appearance. The prostate remains normal in size. No inguinal lymphadenopathy is seen. No acute osseous abnormalities are identified. Vacuum phenomenon is noted at L5-S1. IMPRESSION: 1. 6.9 x 4.6 cm mass noted at the splenic flexure of the colon, with circumferential wall thickening, and surrounding soft tissue inflammation and trace fluid, compatible with prior right colonic malignancy. Mild adjacent nodularity measures up to 1.1 cm. Few mesenteric nodes seen, measuring up to 6 mm in short axis, likely reflecting nodal spread of disease. 2. Nonspecific 1.0 cm nodule noted lateral to the spleen. This may simply reflect a splenule. Electronically Signed   By: Garald Balding M.D.   On: 06/28/2015 23:46    ENDOSCOPIC STUDIES: None ever  IMPRESSION:   *  Microcytic anemia.  s/p PRBCx  3  *  Colon mass at splenic flexure.  Likely cancer.   *  Dysphagia, odynophagia. Rule out esophageal stricture, rule  out esophageal mass, rule out esophageal spasm.  *  Hep C positive in the 1990s. Per CT scan in the liver parenchyma is unremarkable.    PLAN:     *  Arranged for colonoscopy and EGD with potential esophageal dilatation for tomorrow.  Given the patient's semi-obstructed state, having BMs only every 1-2 weeks, the prep may be challenging and we may have to delay the case but will attempt to purge the colon in the next 24 hours.  *  Note patient has not discussed all the findings with his daughter and family. She is aware that the blood counts are low but has no idea about the CT scan and likely cancer diagnosis.  *  Note that hepatitis acute panel has been ordered. Also note that anemia labs have been ordered, these are likely to be altered by his 3 units of transfused red cells. With his degree of microcytosis he is certainly iron deficient.  *  CBC is ordered for tomorrow morning.   Azucena Freed  06/29/2015, 1:40 PM Pager: [phone]  GI ATTENDING  Patient personally seen and examined. Labs, x-rays reviewed. Agree with above extensive consultation note as outlined above. 54 year old presents with profound iron deficiency anemia on the background of intermittent abdominal pain and weight loss. Abnormal CT consistent with colon cancer to explain signs and symptoms. Also, complains of dysphagia. Agree with transfusions. Plan colonoscopy with bx tomorrow and EGD with possible dilation.The nature of the procedures, as well as the risks, benefits, and alternatives were carefully and thoroughly reviewed with the patient. Ample time for discussion and questions allowed. The patient understood, was satisfied, and agreed to proceed.Will need surgical consultation as well.  Docia Chuck. Geri Seminole., M.D. [facility]

## 2015-06-29 NOTE — ED Notes (Signed)
Dr. Pfeiffer back at the bedside.  

## 2015-06-29 NOTE — Progress Notes (Signed)
   Subjective: Patient feels well this morning, no pain at the moment. Objective: Vital signs in last 24 hours: Filed Vitals:   06/29/15 0604 06/29/15 0857 06/29/15 0935 06/29/15 1157  BP: 136/80 125/71 130/74 131/82  Pulse: 89 77 77 79  Temp: 98 F (36.7 C) 98.6 F (37 C) 98.6 F (37 C) 98.5 F (36.9 C)  TempSrc: Oral Oral Oral Oral  Resp: 19 18 18 18   Height:      Weight:      SpO2: 100% 100% 100% 100%   Weight change:   Intake/Output Summary (Last 24 hours) at 06/29/15 1726 Last data filed at 06/29/15 1300  Gross per 24 hour  Intake    455 ml  Output      0 ml  Net    455 ml   General: resting in bed Cardiac: RRR, no rubs, murmurs or gallops Pulm: clear to auscultation bilaterally Abd: soft, nontender, nondistended, BS present Ext: warm and well perfused, no pedal edema Neuro: alert and oriented X3   Assessment/Plan: Principal Problem:   Anemia due to gastrointestinal blood loss Active Problems:   Hepatitis C   Cigarette smoker   Colonic mass   Symptomatic anemia  Colonic Mass: Patient with 8 months history of abdominal pain, 10 lb weight loss in the last 3 months, and BRBPR found to have Hgb of 6.4 and 7 x 5 cm mass seen on CT at the splenic flexure that is highly suspicious for colorectal cancer. He was transfused 3 units PRBCs. -Appreciate GI consultation and recommendations -Plan for colonoscopy tomorrow with biopsy -f/u CBC -CEA for baseline  Odynophagia: Patient with occasional difficulty and pain with oral intake, describes as a "stuck in throat" feeling that occurs with solid foods and sometimes with liquids. He denies any regurgitation fo food or vomiting. May be due to stricture, spasm, or obstruction. -Appreciate GI recommendations -Plan for EGD tomorrow with possible dilation  Microcytic anemia: Patient with Hgb of 6.4 and MCV of 60 likely due to iron deficiency and GI blood loss -s/p transfusion 3 PRBCs  Dispo: Disposition is deferred at this  time, awaiting improvement of current medical problems.    The patient does not have a current PCP (No Pcp Per Patient) and does need an [facility] hospital follow-up appointment after discharge.     LOS: 0 days   Zada Finders, MD 06/29/2015, 5:26 PM

## 2015-06-29 NOTE — ED Notes (Signed)
Admitting MD at the bedside.  

## 2015-06-30 ENCOUNTER — Inpatient Hospital Stay (HOSPITAL_COMMUNITY): Payer: Self-pay | Admitting: Anesthesiology

## 2015-06-30 ENCOUNTER — Encounter (HOSPITAL_COMMUNITY): Payer: Self-pay

## 2015-06-30 ENCOUNTER — Encounter (HOSPITAL_COMMUNITY)
Admission: EM | Disposition: A | Discharge status: Home / Self Care | Payer: Self-pay | Source: Home / Self Care | Attending: Oncology

## 2015-06-30 ENCOUNTER — Inpatient Hospital Stay (HOSPITAL_COMMUNITY): Payer: MEDICAID | Admitting: Anesthesiology

## 2015-06-30 DIAGNOSIS — K922 Gastrointestinal hemorrhage, unspecified: Secondary | ICD-10-CM

## 2015-06-30 DIAGNOSIS — R1310 Dysphagia, unspecified: Secondary | ICD-10-CM | POA: Insufficient documentation

## 2015-06-30 DIAGNOSIS — D500 Iron deficiency anemia secondary to blood loss (chronic): Secondary | ICD-10-CM

## 2015-06-30 DIAGNOSIS — D62 Acute posthemorrhagic anemia: Secondary | ICD-10-CM

## 2015-06-30 DIAGNOSIS — C185 Malignant neoplasm of splenic flexure: Principal | ICD-10-CM

## 2015-06-30 HISTORY — PX: COLONOSCOPY WITH PROPOFOL: SHX5780

## 2015-06-30 HISTORY — PX: ESOPHAGOGASTRODUODENOSCOPY (EGD) WITH PROPOFOL: SHX5813

## 2015-06-30 LAB — HEPATITIS PANEL, ACUTE
HCV Ab: <0.1 s/co ratio (ref 0.0–0.9)
HEP A IGM: NEGATIVE
HEP B C IGM: NEGATIVE
HEP B S AG: NEGATIVE

## 2015-06-30 LAB — TYPE AND SCREEN
ABO/RH(D): A POS
Antibody Screen: NEGATIVE
UNIT DIVISION: 0
UNIT DIVISION: 0
Unit division: 0

## 2015-06-30 LAB — CEA: CEA: 3 ng/mL (ref 0.0–4.7)

## 2015-06-30 LAB — HIV ANTIBODY (ROUTINE TESTING W REFLEX): HIV SCREEN 4TH GENERATION: NONREACTIVE

## 2015-06-30 SURGERY — ESOPHAGOGASTRODUODENOSCOPY (EGD) WITH PROPOFOL
Anesthesia: Monitor Anesthesia Care

## 2015-06-30 SURGERY — COLONOSCOPY
Anesthesia: Moderate Sedation

## 2015-06-30 MED ORDER — PROPOFOL 10 MG/ML IV BOLUS
INTRAVENOUS | Status: DC | PRN
  Administered 2015-06-30: 20 mg via INTRAVENOUS
  Administered 2015-06-30: 50 mg via INTRAVENOUS
  Administered 2015-06-30: 30 mg via INTRAVENOUS

## 2015-06-30 MED ORDER — FENTANYL CITRATE (PF) 100 MCG/2ML IJ SOLN
INTRAMUSCULAR | Status: DC | PRN
  Administered 2015-06-30 (×3): 50 ug via INTRAVENOUS

## 2015-06-30 MED ORDER — SODIUM CHLORIDE 0.9 % IV SOLN: INTRAVENOUS | Status: DC

## 2015-06-30 MED ORDER — LACTATED RINGERS IV SOLN
INTRAVENOUS | Status: DC
  Administered 2015-06-30 (×2): via INTRAVENOUS

## 2015-06-30 MED ORDER — OXYCODONE HCL 5 MG PO TABS
5.0000 mg | ORAL_TABLET | ORAL | Status: DC | PRN
  Administered 2015-06-30 (×2): 5 mg via ORAL
  Filled 2015-06-30 (×2): qty 1

## 2015-06-30 MED ORDER — PROPOFOL 500 MG/50ML IV EMUL
INTRAVENOUS | Status: DC | PRN
  Administered 2015-06-30: 75 ug/kg/min via INTRAVENOUS

## 2015-06-30 MED ORDER — TRAMADOL HCL 50 MG PO TABS: 50.0000 mg | ORAL_TABLET | Freq: Four times a day (QID) | ORAL | Status: DC | PRN

## 2015-06-30 MED ORDER — MIDAZOLAM HCL 5 MG/5ML IJ SOLN
INTRAMUSCULAR | Status: DC | PRN
  Administered 2015-06-30: 2 mg via INTRAVENOUS

## 2015-06-30 NOTE — H&P (View-Only) (Signed)
[facility] Consult: 1:40 PM 06/29/2015  LOS: 0 days    Referring Provider: Dr Posey Pronto  Primary Care Physician:  none Primary Gastroenterologist:  unassigned    Reason for Consultation:  Mass at splenic flexure, microcytic anemia.    HPI: Edgar [name] is a 54 y.o. male.  Diagnosed with hepatitis C in the 1990s. At that time he had acute jaundice, pruritus. Hasn't had any known liver disease consequent to this but he doesn't see a doctor regularly. Status post skin graft to injury of the right thumb.  Seen 02/2015 in ED for 2 months of abd pain, altered bowel habits. Labs notable for hemoglobin 8.9, MCV 74. Since he looked well the ED physician did not feel it was necessary to obtain imaging. However EGD M.D. plan was to have patient follow-up with a PMD, but the patient never did this.  Patient has had constipation for several months he moves his bowels every 1-2 weeks. He has seen intermittent small amounts of blood with stools. He has lost about 10 pounds in the last 12 months. He says that for the constipation prune juice is the most effective remedy but OTC laxatives are not helpful. The pain is in the mid lower abdomen and radiates into both right and left lower quadrants and sometimes when severe will radiate into his back. It has become more constant and severe and this is why he presented to the emergency room yesterday. Tylenol is minimally effective in controlling the pain. Having a bowel movement does not improve the pain. Several months ago patient tended to be diarrhea prone but in the last few months the constipation has taken over and caliber of his stool is smaller He also has noticeable dyspnea on exertion in the last few weeks. Has not been dizzy or syncopal however. He describes intermittent  dysphagia to both solids and liquids and has had regurgitation of swallowed food material but no nausea. He has early satiety.  EGD evaluation revealed: Hgb 6.4, MCV 59.  Hgb 02/18/15 was 8.9, MCV 74.  FOBT +.   CT demonstrates mass at splenic flexure with adjacent nodularity, adenopathy c/w cancer.    S/p PRBCs x 3, repeat CBC ordered for tomorrow morning..   No family history of colon cancer or polyps.  She used to consume alcohol, sometimes to excess. However for several months he has not been drinking as he thought this might be contributing to his GI symptoms.      Past Medical History  Diagnosis Date  . Hepatitis C 1990s  . Microcytic anemia 02/2015  . Mass of colon 06/2015    Past Surgical History  Procedure Laterality Date  . Skin graft to repair injury to right thumb.      Prior to Admission medications   Medication Sig Start Date End Date Taking? Authorizing Provider  acetaminophen (TYLENOL) 325 MG tablet Take 650 mg by mouth every 6 (six) hours as needed for mild pain or moderate pain.   Yes Historical Provider, MD    Scheduled Meds: . [START ON 06/30/2015]  Influenza vac split quadrivalent PF  0.5 mL Intramuscular Tomorrow-1000  . [START ON 06/30/2015] pneumococcal 23 valent vaccine  0.5 mL Intramuscular Tomorrow-1000   Infusions:   PRN Meds: acetaminophen **OR** acetaminophen, ondansetron **OR** ondansetron (ZOFRAN) IV, traMADol   Allergies as of 06/28/2015  . (No Known Allergies)    No family history on file.  Social History   Social History  . Marital Status: Married    Spouse Name: N/A  . Number of Children: N/A  . Years of Education: N/A   Occupational History  . Cabin crew    Social History Main Topics  . Smoking status: Current Every Day Smoker -- 0.50 packs/day for 30 years    Types: Cigarettes  . Smokeless tobacco: Not on file  . Alcohol Use: Yes     Comment: socially  . Drug Use: Not on file  . Sexual Activity: Not on file    Other Topics Concern  . Not on file   Social History Narrative    REVIEW OF SYSTEMS: Constitutional:  Per HPI ENT:  No nose bleeds Pulm:  DOE.  No cough CV:  No palpitations, no LE edema.  GU:  No hematuria, no frequency GI:  Per HPi Heme:  Denies excessive bleeding or bruising. Patient unaware of previous anemia.   Transfusions:  No transfusions before these last 24 hours. Neuro:  No headaches, no peripheral tingling or numbness Derm:  No itching, no rash or sores.  Endocrine:  No sweats or chills.  No polyuria or dysuria Immunization:  Did not inquire as to immunizations status. Travel:  None beyond local counties in last few months.    PHYSICAL EXAM: Vital signs in last 24 hours: Filed Vitals:   06/29/15 0935 06/29/15 1157  BP: 130/74 131/82  Pulse: 77 79  Temp: 98.6 F (37 C) 98.5 F (36.9 C)  Resp: 18 18   Wt Readings from Last 3 Encounters:  06/29/15 80.287 kg (177 lb)  02/18/15 101.606 kg (224 lb)    General: Pleasant, well-appearing, non-cachectic, comfortable AAM. Head:  No facial asymmetry or swelling.  Eyes:  No scleral icterus.  Conjunctiva is pale. EOMI Ears:  Not hard of hearing  Nose:  No discharge or congestion Mouth:  Clear, moist, pink oral mucosa. Neck:  No masses, no TMG. No JVD. Lungs:  CTA bil.  No labored breathing or cough. Heart: RRR. No MRG. S1/S2 audible. Abdomen:  Soft.NT, ND.  No masses. No hepatosplenomegaly..   Rectal: Deferred.   Musc/Skeltl: No joint erythema, swelling or gross deformities Extremities:  No CCE. Pedal pulses 3+ bilaterally.  Neurologic:  Oriented 3. Fully alert. No tremors. No limb weakness. Skin:  No sores or rashes. Tattoos:  None observed   Psych:  Pleasant, calm, not depressed.  Intake/Output from previous day:   Intake/Output this shift: Total I/O In: 455 [P.O.:120; Blood:335] Out: -   LAB RESULTS:  Recent Labs  06/28/15 1819  WBC 11.3*  HGB 6.4*  HCT 21.7*  PLT 555*   BMET Lab Results   Component Value Date   NA 142 06/28/2015   NA 138 02/18/2015   K 3.8 06/28/2015   K 4.2 02/18/2015   CL 106 06/28/2015   CL 105 02/18/2015   CO2 27 06/28/2015   CO2 25 02/18/2015   GLUCOSE 124* 06/28/2015   GLUCOSE 104* 02/18/2015   BUN 11 06/28/2015   BUN 17 02/18/2015   CREATININE 1.19 06/28/2015   CREATININE 1.22 02/18/2015   CALCIUM 9.4 06/28/2015  CALCIUM 9.0 02/18/2015   LFT  Recent Labs  06/28/15 1819  PROT 7.0  ALBUMIN 3.7  AST 14*  ALT 10*  ALKPHOS 75  BILITOT 0.2*   PT/INR No results found for: INR, PROTIME Hepatitis Panel No results for input(s): HEPBSAG, HCVAB, HEPAIGM, HEPBIGM in the last 72 hours. C-Diff No components found for: CDIFF Lipase     Component Value Date/Time   LIPASE 32 06/28/2015 1819    Drugs of Abuse     Component Value Date/Time   LABOPIA NONE DETECTED 06/28/2015 1936   COCAINSCRNUR NONE DETECTED 06/28/2015 1936   LABBENZ NONE DETECTED 06/28/2015 1936   AMPHETMU NONE DETECTED 06/28/2015 1936   THCU NONE DETECTED 06/28/2015 1936   LABBARB NONE DETECTED 06/28/2015 1936     RADIOLOGY STUDIES: Dg Chest 2 View  06/29/2015  CLINICAL DATA:  Week.  History of colon mass. EXAM: CHEST  2 VIEW COMPARISON:  None. FINDINGS: The heart size and mediastinal contours are within normal limits. Both lungs are clear. The visualized skeletal structures are unremarkable. IMPRESSION: No active cardiopulmonary disease. Electronically Signed   By: Kerby Moors M.D.   On: 06/29/2015 13:26   Ct Abdomen Pelvis W Contrast  06/28/2015  CLINICAL DATA:  Chronic lower abdominal pain and irregular constipation. Initial encounter. EXAM: CT ABDOMEN AND PELVIS WITH CONTRAST TECHNIQUE: Multidetector CT imaging of the abdomen and pelvis was performed using the standard protocol following bolus administration of intravenous contrast. CONTRAST:  19mL OMNIPAQUE IOHEXOL 300 MG/ML  SOLN COMPARISON:  None. FINDINGS: Minimal left basilar atelectasis is noted. The  liver and spleen are unremarkable in appearance. The gallbladder is within normal limits. The pancreas and adrenal glands are unremarkable. Mild nonspecific perinephric stranding is noted bilaterally. The kidneys are otherwise unremarkable. No renal or ureteral stones are seen. There is no evidence of hydronephrosis. No free fluid is identified. The small bowel is unremarkable in appearance. The stomach is within normal limits. No acute vascular abnormalities are seen. Minimal calcification is noted at the distal abdominal aorta. The appendix is normal in caliber and contains air, without evidence of appendicitis. There is segmental circumferential wall thickening noted along the splenic flexure of the colon, measuring approximately 6.9 x 4.6 cm, with surrounding soft tissue inflammation and trace fluid, and mild adjacent nodularity measuring up to 1.1 cm. A few mesenteric nodes are seen, measuring up to 6 mm in short axis. This is compatible with primary colonic malignancy. A nonspecific 1.0 cm nodule is noted lateral to the spleen. This may simply reflect a splenule. The bladder is moderately distended and grossly unremarkable in appearance. The prostate remains normal in size. No inguinal lymphadenopathy is seen. No acute osseous abnormalities are identified. Vacuum phenomenon is noted at L5-S1. IMPRESSION: 1. 6.9 x 4.6 cm mass noted at the splenic flexure of the colon, with circumferential wall thickening, and surrounding soft tissue inflammation and trace fluid, compatible with prior right colonic malignancy. Mild adjacent nodularity measures up to 1.1 cm. Few mesenteric nodes seen, measuring up to 6 mm in short axis, likely reflecting nodal spread of disease. 2. Nonspecific 1.0 cm nodule noted lateral to the spleen. This may simply reflect a splenule. Electronically Signed   By: Garald Balding M.D.   On: 06/28/2015 23:46    ENDOSCOPIC STUDIES: None ever  IMPRESSION:   *  Microcytic anemia.  s/p PRBCx  3  *  Colon mass at splenic flexure.  Likely cancer.   *  Dysphagia, odynophagia. Rule out esophageal stricture, rule  out esophageal mass, rule out esophageal spasm.  *  Hep C positive in the 1990s. Per CT scan in the liver parenchyma is unremarkable.    PLAN:     *  Arranged for colonoscopy and EGD with potential esophageal dilatation for tomorrow.  Given the patient's semi-obstructed state, having BMs only every 1-2 weeks, the prep may be challenging and we may have to delay the case but will attempt to purge the colon in the next 24 hours.  *  Note patient has not discussed all the findings with his daughter and family. She is aware that the blood counts are low but has no idea about the CT scan and likely cancer diagnosis.  *  Note that hepatitis acute panel has been ordered. Also note that anemia labs have been ordered, these are likely to be altered by his 3 units of transfused red cells. With his degree of microcytosis he is certainly iron deficient.  *  CBC is ordered for tomorrow morning.   Azucena Freed  06/29/2015, 1:40 PM Pager: [phone]  GI ATTENDING  Patient personally seen and examined. Labs, x-rays reviewed. Agree with above extensive consultation note as outlined above. 54 year old presents with profound iron deficiency anemia on the background of intermittent abdominal pain and weight loss. Abnormal CT consistent with colon cancer to explain signs and symptoms. Also, complains of dysphagia. Agree with transfusions. Plan colonoscopy with bx tomorrow and EGD with possible dilation.The nature of the procedures, as well as the risks, benefits, and alternatives were carefully and thoroughly reviewed with the patient. Ample time for discussion and questions allowed. The patient understood, was satisfied, and agreed to proceed.Will need surgical consultation as well.  Docia Chuck. Geri Seminole., M.D. [facility] Division of Gastroenterology

## 2015-06-30 NOTE — Discharge Instructions (Signed)
You were admitted with a mass in your colon. This was biopsied.   Please follow up with the surgery clinic.  Also follow up at the internal medicine center for your primary care needs.

## 2015-06-30 NOTE — Interval H&P Note (Signed)
History and Physical Interval Note:  06/30/2015 11:14 AM  [name]  has presented today for surgery, with the diagnosis of anemia, colon mass   The various methods of treatment have been discussed with the patient and family. After consideration of risks, benefits and other options for treatment, the patient has consented to  Procedure(s): ESOPHAGOGASTRODUODENOSCOPY (EGD) WITH PROPOFOL (N/A) COLONOSCOPY WITH PROPOFOL (N/A) as a surgical intervention .  The patient's history has been reviewed, patient examined, no change in status, stable for surgery.  I have reviewed the patient's chart and labs.  Questions were answered to the patient's satisfaction.     Edgar [name]

## 2015-06-30 NOTE — Op Note (Signed)
[facility] [place], [address]   ENDOSCOPY PROCEDURE REPORT  PATIENT: [name], Edgar  MR#: [id number] BIRTHDATE: [birth year] , 54  yrs. old GENDER: male ENDOSCOPIST: Eustace Quail, MD REFERRED BY:  Murriel Hopper, M.D. PROCEDURE DATE:  06/30/2015 PROCEDURE:  EGD, diagnostic ASA CLASS:     Class II INDICATIONS:  dysphagia. MEDICATIONS: Monitored anesthesia care and Per Anesthesia TOPICAL ANESTHETIC: none  DESCRIPTION OF PROCEDURE: After the risks benefits and alternatives of the procedure were thoroughly explained, informed consent was obtained.  The Pentax Gastroscope [id number] endoscope was introduced through the mouth and advanced to the second portion of the duodenum , Without limitations.  The instrument was slowly withdrawn as the mucosa was fully examined.     EXAM: The esophagus and gastroesophageal junction were completely normal in appearance.  The stomach was entered and closely examined.The antrum, angularis, and lesser curvature were well visualized, including a retroflexed view of the cardia and fundus. The stomach wall was normally distensable.  The scope passed easily through the pylorus into the duodenum.  Retroflexed views revealed no abnormalities.     The scope was then withdrawn from the patient and the procedure completed.  COMPLICATIONS: There were no immediate complications.  ENDOSCOPIC IMPRESSION: 1. Normal EGD  RECOMMENDATIONS: 1. See colonoscopy report  Findings discussed with patient. He was provided procedure reports. GI will sign off, but available if needed  REPEAT EXAM:  eSigned:  Eustace Quail, MD 06/30/2015 12:03 PM    CC:The Patient and Murriel Hopper, MD

## 2015-06-30 NOTE — Op Note (Signed)
[facility] [place], [address]   COLONOSCOPY PROCEDURE REPORT  PATIENT: Edgar [name], Edgar [name]  MR#: [id number] BIRTHDATE: Aug 15, 1960 , 54  yrs. old GENDER: male ENDOSCOPIST: Eustace Quail, MD REFERRED [id number] [place], M.D. PROCEDURE DATE:  06/30/2015 PROCEDURE:   Colonoscopy, diagnostic and Colonoscopy with biopsy  ASA CLASS:   Class II INDICATIONS:iron deficiency anemia and an abnormal CT. MEDICATIONS: Monitored anesthesia care and Per Anesthesia  DESCRIPTION OF PROCEDURE:   After the risks benefits and alternatives of the procedure were thoroughly explained, informed consent was obtained.  The digital rectal exam revealed no abnormalities of the rectum.   The Pentax Adult Colon [id number] endoscope was introduced through the anus and advanced to the splenic flexure. No adverse events experienced.   Limited by an obstruction.   The quality of the prep was excellent.  (MoviPrep was used)  The instrument was then slowly withdrawn as the colon was fully examined. Estimated blood loss is zero unless otherwise noted in this procedure report.      COLON FINDINGS: A large somewhat friable obstructing mass was found at the splenic flexure. The colonoscope could not pass beyond the obstruction. Multiple biopsies taken.   The examination of the colon distal to the lesion was otherwise normal.  Retroflexed views revealed internal hemorrhoids. The time to cecum =    Withdrawal time =      The scope was withdrawn and the procedure completed. COMPLICATIONS: There were no immediate complications.  ENDOSCOPIC IMPRESSION: 1.   Large malignant appearing Mass was found at the splenic flexure  2.   The examination was incomplete beyond the lesion and normal distal to the lesion  RECOMMENDATIONS: 1.  Await biopsy results 2.  Surgical consultation for segmental resection of tumor 3. Will need colonoscopy in 6-12 months to clear proximal  colon. 4. EGD today. Please see report   Discussed with patient and provided him with copies of his procedures. eSigned:  Eustace Quail, MD 06/30/2015 12:00 PM   cc: The Patient and Murriel Hopper, MD

## 2015-06-30 NOTE — Progress Notes (Signed)
   Subjective: Patient without pain today, feels well.  Objective: Vital signs in last 24 hours: Filed Vitals:   06/29/15 2134 06/30/15 0426 06/30/15 1000 06/30/15 1053  BP: 130/74 135/70 136/73 145/86  Pulse: 74 87 87 88  Temp: 98.5 F (36.9 C) 98.1 F (36.7 C) 98.4 F (36.9 C) 98.8 F (37.1 C)  TempSrc: Oral Oral Oral Oral  Resp: 19 18 18 13   Height:    6\' 3"  (1.905 m)  Weight: 177 lb 0.5 oz (80.3 kg)   180 lb (81.647 kg)  SpO2: 100% 100% 100% 99%   Weight change: -2 lb 15.5 oz (-1.347 kg)  Intake/Output Summary (Last 24 hours) at 06/30/15 1140 Last data filed at 06/30/15 0900  Gross per 24 hour  Intake    695 ml  Output      0 ml  Net    695 ml   General: NAD, pleasant Cardiac: RRR, no rubs, murmurs or gallops Pulm: clear to auscultation bilaterally Abd: soft, nontender, nondistended, BS present Ext: warm and well perfused, no pedal edema Neuro: alert and oriented X3   Assessment/Plan: Principal Problem:   Anemia due to gastrointestinal blood loss Active Problems:   Hepatitis C   Cigarette smoker   Colonic mass   Symptomatic anemia  Colonic Mass: Patient with 8 months history of abdominal pain, 10 lb weight loss in the last 3 months, and BRBPR found to have Hgb of 6.4 and 7 x 5 cm mass seen on CT at the splenic flexure that is highly suspicious for colorectal cancer. He was transfused 3 units PRBCs. Latest Hgb 9.3. Baseline CEA of 3.0. -Appreciate GI consultation and recommendations -Colonoscopy and biopsy today -General Surgery consulted, appreciate recommendations  Odynophagia: Patient with occasional difficulty and pain with oral intake, describes as a "stuck in throat" feeling that occurs with solid foods and sometimes with liquids. He denies any regurgitation fo food or vomiting. May be due to stricture, spasm, or obstruction. -Appreciate GI recommendations -EGD today    Dispo: Disposition is deferred at this time, awaiting improvement of current  medical problems.    The patient does not have a current PCP (No Pcp Per Patient) and does need an [facility] hospital follow-up appointment after discharge.     LOS: 1 day   Zada Finders, MD 06/30/2015, 11:40 AM

## 2015-06-30 NOTE — Progress Notes (Signed)
Edgar [name] to be D/C'd Home per MD order.  Discussed prescriptions and follow up appointments with the patient. Prescriptions given to patient, medication list explained in detail. Pt verbalized understanding.    Medication List    TAKE these medications        acetaminophen 325 MG tablet  Commonly known as:  TYLENOL  Take 650 mg by mouth every 6 (six) hours as needed for mild pain or moderate pain.     traMADol 50 MG tablet  Commonly known as:  ULTRAM  Take 1 tablet (50 mg total) by mouth every 6 (six) hours as needed for severe pain.        Filed Vitals:   06/30/15 1220 06/30/15 1704  BP: 113/86 124/78  Pulse: 81 80  Temp:  98.2 F (36.8 C)  Resp: 18 18    Skin clean, dry and intact without evidence of skin break down, no evidence of skin tears noted. IV catheter discontinued intact. Site without signs and symptoms of complications. Dressing and pressure applied. Pt denies pain at this time. No complaints noted.  An After Visit Summary was printed and given to the patient. Patient escorted via [place], and D/C home via private auto.  Carole Civil RN [facility] Phone [phone]

## 2015-06-30 NOTE — Anesthesia Postprocedure Evaluation (Signed)
Anesthesia Post Note  Patient: Edgar [name]  Procedure(s) Performed: Procedure(s) (LRB): ESOPHAGOGASTRODUODENOSCOPY (EGD) WITH PROPOFOL (N/A) COLONOSCOPY WITH PROPOFOL (N/A)  Patient location during evaluation: PACU Anesthesia Type: MAC Level of consciousness: awake and alert Pain management: pain level controlled Vital Signs Assessment: post-procedure vital signs reviewed and stable Respiratory status: spontaneous breathing, nonlabored ventilation, respiratory function stable and patient connected to nasal cannula oxygen Cardiovascular status: stable and blood pressure returned to baseline Anesthetic complications: no    Last Vitals:  Filed Vitals:   06/30/15 1210 06/30/15 1220  BP: 104/59 113/86  Pulse: 77 81  Temp: 36.5 C   Resp: 14 18    Last Pain:  Filed Vitals:   06/30/15 1220  PainSc: 0-No pain                 Effie Berkshire

## 2015-06-30 NOTE — Anesthesia Preprocedure Evaluation (Addendum)
Anesthesia Evaluation  Patient identified by MRN, date of birth, ID band Patient awake    Reviewed: Allergy & Precautions, NPO status , Patient's Chart, lab work & pertinent test results  Airway Mallampati: II  TM Distance: >3 FB Neck ROM: Full    Dental  (+) Teeth Intact   Pulmonary Current Smoker,    breath sounds clear to auscultation       Cardiovascular negative cardio ROS   Rhythm:Regular Rate:Normal     Neuro/Psych negative neurological ROS  negative psych ROS   GI/Hepatic negative GI ROS, (+) Hepatitis -, C  Endo/Other  negative endocrine ROS  Renal/GU negative Renal ROS  negative genitourinary   Musculoskeletal negative musculoskeletal ROS (+)   Abdominal   Peds negative pediatric ROS (+)  Hematology   Anesthesia Other Findings   Reproductive/Obstetrics negative OB ROS                           Lab Results  Component Value Date   WBC 9.3 06/29/2015   HGB 9.3* 06/29/2015   HCT 30.0* 06/29/2015   MCV 67.4* 06/29/2015   PLT 559* 06/29/2015   Lab Results  Component Value Date   CREATININE 1.19 06/28/2015   BUN 11 06/28/2015   NA 142 06/28/2015   K 3.8 06/28/2015   CL 106 06/28/2015   CO2 27 06/28/2015   No results found for: INR, PROTIME  EKG: normal sinus rhythm.   Anesthesia Physical Anesthesia Plan  ASA: II  Anesthesia Plan: MAC   Post-op Pain Management:    Induction: Intravenous  Airway Management Planned: Natural Airway and Simple Face Mask  Additional Equipment:   Intra-op Plan:   Post-operative Plan:   Informed Consent: I have reviewed the patients History and Physical, chart, labs and discussed the procedure including the risks, benefits and alternatives for the proposed anesthesia with the patient or authorized representative who has indicated his/her understanding and acceptance.   Dental advisory given  Plan Discussed with:  CRNA  Anesthesia Plan Comments:         Anesthesia Quick Evaluation

## 2015-06-30 NOTE — Transfer of Care (Signed)
Immediate Anesthesia Transfer of Care Note  Patient: Edgar [name]  Procedure(s) Performed: Procedure(s): ESOPHAGOGASTRODUODENOSCOPY (EGD) WITH PROPOFOL (N/A) COLONOSCOPY WITH PROPOFOL (N/A)  Patient Location: PACU  Anesthesia Type:MAC  Level of Consciousness: awake, alert , oriented and sedated  Airway & Oxygen Therapy: Patient Spontanous Breathing and Patient connected to nasal cannula oxygen  Post-op Assessment: Report given to RN, Post -op Vital signs reviewed and stable and Patient moving all extremities  Post vital signs: Reviewed and stable  Last Vitals:  Filed Vitals:   06/30/15 1053 06/30/15 1210  BP: 145/86 104/59  Pulse: 88 77  Temp: 37.1 C   Resp: 13 14    Complications: No apparent anesthesia complications

## 2015-06-30 NOTE — Consult Note (Signed)
Reason for Consult:  Colon Mass Referring Physician: Dr. Loleta Chance  Edgar [name] is an 54 y.o. male.   HPI: Pt presented in July with abdominal pain and anemia.  He told admitting doctor he had pain going back 8 months  He described sharpe pain wrapping around his back, mostly at the base of his midabdomen  In July he was referred to Medical follow up, but has apparently not followed up.  His pain is becoming more acute, he has lost 10 pounds last 3 months, he reports blood from his rectum, and dark stools which he attributed to hemorrhoids. He presented to the ED on 06/28/15, with increasing pain, "low energy," and constipation.  He notes early filling with constipation, pain comes and goes, initially it would last a few hours, but was lasting up to 3 days when he presented this admission.   Work up in the ED shows he is afebrile, VSS.  CMP was normal but the H/H is 6.4/21.7.  CT of the abdomen shows: 6.9 x 4.6 cm mass noted at the splenic flexure of the colon, with circumferential wall thickening, and surrounding soft tissue inflammation and trace fluid, compatible with prior right colonic malignancy. Mild adjacent nodularity measures up to 1.1 cm. Few mesenteric nodes seen, measuring up to 6 mm in short axis, likely reflecting nodal spread of disease. Nonspecific 1.0 cm nodule noted lateral to the spleen. This may simply reflect a splenule.  He was admitted and transfused.  Seen by GI, and underwent colonoscopy with finding of large malignant appearing mass, at the splenic flexure.  Examination was incomplete beyond the lesion and normal distal to the lesion.  Biopsy's are pending.  We are ask to see.  Past Medical History  Diagnosis Date  . Hepatitis C 1990s  . Microcytic anemia 02/2015  . Mass of colon 06/2015  . Malignant neoplasm of splenic flexure [facility])     Past Surgical History  Procedure Laterality Date  . Skin graft to repair injury to right thumb.      History reviewed. No  pertinent family history.  Social History:  reports that he has been smoking Cigarettes.  He has a 15 pack-year smoking history. He does not have any smokeless tobacco history on file. He reports that he drinks alcohol. His drug history is not on file. Tobacco : 1PPD ETOH:  1/2 pint per day for 10 years, dry for 8 months Drugs:  None    Allergies: No Known Allergies  Medications:  Prior to Admission:  Prescriptions prior to admission  Medication Sig Dispense Refill Last Dose  . acetaminophen (TYLENOL) 325 MG tablet Take 650 mg by mouth every 6 (six) hours as needed for mild pain or moderate pain.   06/27/2015 at Unknown time   Scheduled: . bisacodyl  5 mg Oral Once   Continuous:  ESP:[id number] **OR** acetaminophen, ondansetron **OR** ondansetron (ZOFRAN) IV, oxyCODONE, traMADol Anti-infectives    None      Results for orders placed or performed during the hospital encounter of 06/28/15 (from the past 48 hour(s))  Lipase, blood     Status: None   Collection Time: 06/28/15  6:19 PM  Result Value Ref Range   Lipase 32 11 - 51 U/L  Comprehensive metabolic panel     Status: Abnormal   Collection Time: 06/28/15  6:19 PM  Result Value Ref Range   Sodium 142 135 - 145 mmol/L   Potassium 3.8 3.5 - 5.1 mmol/L   Chloride 106 101 -  111 mmol/L   CO2 27 22 - 32 mmol/L   Glucose, Bld 124 (H) 65 - 99 mg/dL   BUN 11 6 - 20 mg/dL   Creatinine, Ser 1.19 0.61 - 1.24 mg/dL   Calcium 9.4 8.9 - 10.3 mg/dL   Total Protein 7.0 6.5 - 8.1 g/dL   Albumin 3.7 3.5 - 5.0 g/dL   AST 14 (L) 15 - 41 U/L   ALT 10 (L) 17 - 63 U/L   Alkaline Phosphatase 75 38 - 126 U/L   Total Bilirubin 0.2 (L) 0.3 - 1.2 mg/dL   GFR calc non Af Amer >60 >60 mL/min   GFR calc Af Amer >60 >60 mL/min    Comment: (NOTE) The eGFR has been calculated using the CKD EPI equation. This calculation has not been validated in all clinical situations. eGFR's persistently <60 mL/min signify possible Chronic Kidney Disease.     Anion gap 9 5 - 15  CBC     Status: Abnormal   Collection Time: 06/28/15  6:19 PM  Result Value Ref Range   WBC 11.3 (H) 4.0 - 10.5 K/uL   RBC 3.63 (L) 4.22 - 5.81 MIL/uL   Hemoglobin 6.4 (LL) 13.0 - 17.0 g/dL    Comment: REPEATED TO VERIFY CRITICAL RESULT CALLED TO, READ BACK BY AND VERIFIED WITH: K. TACE RN [id number] GREEN R    HCT 21.7 (L) 39.0 - 52.0 %   MCV 59.8 (L) 78.0 - 100.0 fL   MCH 17.6 (L) 26.0 - 34.0 pg   MCHC 29.5 (L) 30.0 - 36.0 g/dL   RDW 19.5 (H) 11.5 - 15.5 %   Platelets 555 (H) 150 - 400 K/uL    Comment: PLATELET COUNT CONFIRMED BY SMEAR  Urinalysis, Routine w reflex microscopic (not at [facility])     Status: None   Collection Time: 06/28/15  7:36 PM  Result Value Ref Range   Color, Urine YELLOW YELLOW   APPearance CLEAR CLEAR   Specific Gravity, Urine 1.018 1.005 - 1.030   pH 7.0 5.0 - 8.0   Glucose, UA NEGATIVE NEGATIVE mg/dL   Hgb urine dipstick NEGATIVE NEGATIVE   Bilirubin Urine NEGATIVE NEGATIVE   Ketones, ur NEGATIVE NEGATIVE mg/dL   Protein, ur NEGATIVE NEGATIVE mg/dL   Nitrite NEGATIVE NEGATIVE   Leukocytes, UA NEGATIVE NEGATIVE    Comment: MICROSCOPIC NOT DONE ON URINES WITH NEGATIVE PROTEIN, BLOOD, LEUKOCYTES, NITRITE, OR GLUCOSE <1000 mg/dL.  Urine rapid drug screen (hosp performed)     Status: None   Collection Time: 06/28/15  7:36 PM  Result Value Ref Range   Opiates NONE DETECTED NONE DETECTED   Cocaine NONE DETECTED NONE DETECTED   Benzodiazepines NONE DETECTED NONE DETECTED   Amphetamines NONE DETECTED NONE DETECTED   Tetrahydrocannabinol NONE DETECTED NONE DETECTED   Barbiturates NONE DETECTED NONE DETECTED    Comment:        DRUG SCREEN FOR MEDICAL PURPOSES ONLY.  IF CONFIRMATION IS NEEDED FOR ANY PURPOSE, NOTIFY LAB WITHIN 5 DAYS.        LOWEST DETECTABLE LIMITS FOR URINE DRUG SCREEN Drug Class       Cutoff (ng/mL) Amphetamine      1000 Barbiturate      200 Benzodiazepine   951 Tricyclics       884 Opiates           300 Cocaine          300 THC  50   Prepare RBC     Status: None   Collection Time: 06/28/15  9:49 PM  Result Value Ref Range   Order Confirmation ORDER PROCESSED BY BLOOD BANK   POC occult blood, ED Provider will collect     Status: Abnormal   Collection Time: 06/28/15  9:57 PM  Result Value Ref Range   Fecal Occult Bld POSITIVE (A) NEGATIVE  Type and screen [place]     Status: None   Collection Time: 06/28/15 10:25 PM  Result Value Ref Range   ABO/RH(D) A POS    Antibody Screen NEG    Sample Expiration 07/01/2015    Unit Number [id number]    Blood Component Type RED CELLS,LR    Unit division 00    Status of Unit ISSUED,FINAL    Transfusion Status OK TO TRANSFUSE    Crossmatch Result Compatible    Unit Number [id number]    Blood Component Type RED CELLS,LR    Unit division 00    Status of Unit ISSUED,FINAL    Transfusion Status OK TO TRANSFUSE    Crossmatch Result Compatible    Unit Number [id number]    Blood Component Type RED CELLS,LR    Unit division 00    Status of Unit ISSUED,FINAL    Transfusion Status OK TO TRANSFUSE    Crossmatch Result Compatible   ABO/Rh     Status: None   Collection Time: 06/28/15 10:25 PM  Result Value Ref Range   ABO/RH(D) A POS   HIV antibody     Status: None   Collection Time: 06/29/15  1:50 PM  Result Value Ref Range   HIV Screen 4th Generation wRfx Non Reactive Non Reactive    Comment: (NOTE) Performed At: [facility] [address] [place], [place] [id number] Lindon Romp MD [id number]   Hepatitis panel, acute     Status: None   Collection Time: 06/29/15  1:50 PM  Result Value Ref Range   Hepatitis B Surface Ag Negative Negative   HCV Ab <0.1 0.0 - 0.9 s/co ratio    Comment: (NOTE)                                  Negative:     < 0.8                             Indeterminate: 0.8 - 0.9                                  Positive:     > 0.9 The CDC recommends that a  positive HCV antibody result be followed up with a HCV Nucleic Acid Amplification test ([id number]). Performed At: [facility] [place], [place] [id number] Lindon Romp MD [id number]    Hep A IgM Negative Negative   Hep B C IgM Negative Negative  CBC     Status: Abnormal   Collection Time: 06/29/15  1:50 PM  Result Value Ref Range   WBC 9.3 4.0 - 10.5 K/uL   RBC 4.45 4.22 - 5.81 MIL/uL   Hemoglobin 9.3 (L) 13.0 - 17.0 g/dL    Comment: POST TRANSFUSION SPECIMEN   HCT 30.0 (L) 39.0 - 52.0 %   MCV 67.4 (L) 78.0 - 100.0 fL  Comment: POST TRANSFUSION SPECIMEN   MCH 20.9 (L) 26.0 - 34.0 pg   MCHC 31.0 30.0 - 36.0 g/dL   RDW 24.4 (H) 11.5 - 15.5 %   Platelets 559 (H) 150 - 400 K/uL  Vitamin B12     Status: None   Collection Time: 06/29/15  1:50 PM  Result Value Ref Range   Vitamin B-12 212 180 - 914 pg/mL    Comment: (NOTE) This assay is not validated for testing neonatal or myeloproliferative syndrome specimens for Vitamin B12 levels.   Folate     Status: None   Collection Time: 06/29/15  1:50 PM  Result Value Ref Range   Folate 13.3 >5.9 ng/mL  Iron and TIBC     Status: Abnormal   Collection Time: 06/29/15  1:50 PM  Result Value Ref Range   Iron 224 (H) 45 - 182 ug/dL   TIBC 538 (H) 250 - 450 ug/dL   Saturation Ratios 42 (H) 17.9 - 39.5 %   UIBC 314 ug/dL  Ferritin     Status: Abnormal   Collection Time: 06/29/15  1:50 PM  Result Value Ref Range   Ferritin 6 (L) 24 - 336 ng/mL  Reticulocytes     Status: None   Collection Time: 06/29/15  1:50 PM  Result Value Ref Range   Retic Ct Pct 0.5 0.4 - 3.1 %   RBC. 4.37 4.22 - 5.81 MIL/uL   Retic Count, Manual 21.9 19.0 - 186.0 K/uL  CEA     Status: None   Collection Time: 06/29/15  1:50 PM  Result Value Ref Range   CEA 3.0 0.0 - 4.7 ng/mL    Comment: (NOTE)       Roche ECLIA methodology       Nonsmokers  <3.9                                     Smokers     <5.6 Performed At: [facility] [address] [place], [place] [id number] Lindon Romp MD [id number]     Dg Chest 2 View  06/29/2015  CLINICAL DATA:  Week.  History of colon mass. EXAM: CHEST  2 VIEW COMPARISON:  None. FINDINGS: The heart size and mediastinal contours are within normal limits. Both lungs are clear. The visualized skeletal structures are unremarkable. IMPRESSION: No active cardiopulmonary disease. Electronically Signed   By: Kerby Moors M.D.   On: 06/29/2015 13:26   Ct Abdomen Pelvis W Contrast  06/28/2015  CLINICAL DATA:  Chronic lower abdominal pain and irregular constipation. Initial encounter. EXAM: CT ABDOMEN AND PELVIS WITH CONTRAST TECHNIQUE: Multidetector CT imaging of the abdomen and pelvis was performed using the standard protocol following bolus administration of intravenous contrast. CONTRAST:  169m OMNIPAQUE IOHEXOL 300 MG/ML  SOLN COMPARISON:  None. FINDINGS: Minimal left basilar atelectasis is noted. The liver and spleen are unremarkable in appearance. The gallbladder is within normal limits. The pancreas and adrenal glands are unremarkable. Mild nonspecific perinephric stranding is noted bilaterally. The kidneys are otherwise unremarkable. No renal or ureteral stones are seen. There is no evidence of hydronephrosis. No free fluid is identified. The small bowel is unremarkable in appearance. The stomach is within normal limits. No acute vascular abnormalities are seen. Minimal calcification is noted at the distal abdominal aorta. The appendix is normal in caliber and contains air, without evidence of appendicitis. There is segmental circumferential wall thickening  noted along the splenic flexure of the colon, measuring approximately 6.9 x 4.6 cm, with surrounding soft tissue inflammation and trace fluid, and mild adjacent nodularity measuring up to 1.1 cm. A few mesenteric nodes are seen, measuring up to 6 mm in short axis. This is compatible with primary colonic malignancy. A  nonspecific 1.0 cm nodule is noted lateral to the spleen. This may simply reflect a splenule. The bladder is moderately distended and grossly unremarkable in appearance. The prostate remains normal in size. No inguinal lymphadenopathy is seen. No acute osseous abnormalities are identified. Vacuum phenomenon is noted at L5-S1. IMPRESSION: 1. 6.9 x 4.6 cm mass noted at the splenic flexure of the colon, with circumferential wall thickening, and surrounding soft tissue inflammation and trace fluid, compatible with prior right colonic malignancy. Mild adjacent nodularity measures up to 1.1 cm. Few mesenteric nodes seen, measuring up to 6 mm in short axis, likely reflecting nodal spread of disease. 2. Nonspecific 1.0 cm nodule noted lateral to the spleen. This may simply reflect a splenule. Electronically Signed   By: Garald Balding M.D.   On: 06/28/2015 23:46    Review of Systems  Constitutional: Positive for weight loss (10 pounds over 3 months) and malaise/fatigue. Negative for fever, chills and diaphoresis.  HENT: Negative.   Eyes: Negative.   Respiratory: Positive for shortness of breath (DOE).   Cardiovascular: Negative.   Gastrointestinal: Positive for abdominal pain, constipation and blood in stool. Negative for heartburn, nausea and vomiting.  Genitourinary: Negative.   Musculoskeletal: Negative.   Skin: Negative.   Neurological: Positive for weakness. Negative for dizziness, tingling, tremors, sensory change, speech change, focal weakness, seizures and loss of consciousness.  Endo/Heme/Allergies: Negative.   Psychiatric/Behavioral: Negative.    Blood pressure 113/86, pulse 81, temperature 97.7 F (36.5 C), temperature source Oral, resp. rate 18, height 6' 3"  (1.905 m), weight 81.647 kg (180 lb), SpO2 97 %. Physical Exam  Constitutional: He is oriented to person, place, and time. He appears well-developed and well-nourished. No distress.  HENT:  Head: Normocephalic and atraumatic.  Nose:  Nose normal.  Eyes: Conjunctivae and EOM are normal. Right eye exhibits no discharge. Left eye exhibits no discharge. No scleral icterus.  Neck: Neck supple. No JVD present. No tracheal deviation present. No thyromegaly present.  Cardiovascular: Normal rate, regular rhythm, normal heart sounds and intact distal pulses.   No murmur heard. Respiratory: Effort normal and breath sounds normal. No respiratory distress. He has no wheezes. He has no rales. He exhibits no tenderness.  GI: Soft. Bowel sounds are normal. He exhibits no distension and no mass. There is no tenderness. There is no rebound and no guarding.  Musculoskeletal: Normal range of motion. He exhibits no edema.  Lymphadenopathy:    He has no cervical adenopathy.  Neurological: He is alert and oriented to person, place, and time. No cranial nerve deficit.  Skin: Skin is warm and dry. No rash noted. He is not diaphoretic. No erythema. No pallor.  Psychiatric: He has a normal mood and affect. His behavior is normal. Judgment and thought content normal.    Assessment/Plan: 6.9 x 4.6 Colon mass Anemia/transfused 3 units PRBC Hx of hepatitis C Hx of ETOH use Hx of tobacco use  Plan:  He looks and feels better, no pain now, some of the pain seems to be related to constipation.  He would like to go home and come back for surgery.  He says he's willing to do prep again.  Dr. Brantley Stage will  see and make recommendations later today.     Edgar [name] 06/30/2015, 1:04 PM

## 2015-07-01 NOTE — Discharge Summary (Signed)
Name: Edgar [name] MRN: [id number] DOB: [birth year] 54 y.o. PCP: No Pcp Per Patient  Date of Admission: 06/28/2015  7:42 PM Date of Discharge: 06/30/2015 Attending Physician: Dr. Murriel [name]  Discharge Diagnosis: 1. Colonic mass at the splenic flexure Principal Problem:   Anemia due to gastrointestinal blood loss Active Problems:   Hepatitis C   Cigarette smoker   Colonic mass   Symptomatic anemia   Malignant neoplasm of splenic flexure (HCC)   Dysphagia  Discharge Medications:   Medication List    TAKE these medications        acetaminophen 325 MG tablet  Commonly known as:  TYLENOL  Take 650 mg by mouth every 6 (six) hours as needed for mild pain or moderate pain.     traMADol 50 MG tablet  Commonly known as:  ULTRAM  Take 1 tablet (50 mg total) by mouth every 6 (six) hours as needed for severe pain.        Disposition and follow-up:   Edgar [name] was discharged from [facility] in Stable condition.  At the hospital follow up visit please address:  1. -Please address whether patient has further bloody bowel movements, abdominal pain  -If patient has been able to attend and keep follow up appointment with surgery  2.  Labs / imaging needed at time of follow-up: CBC  3.  Pending labs/ test needing follow-up: Sugical pathology collected on 06/30/15  Follow-up Appointments: Follow-up Information    Follow up with [facility].   Specialty:  General Surgery   Why:  the clinic will call you with an appointment.    Contact information:   [address] [place] [address] [phone]       Follow up with [facility].   Why:  they will call you for an appointment.    Contact information:   [address] [place] [id number]      Discharge Instructions:   Consultations: Treatment Team:  Edgar Nations, MD  Procedures Performed:  Dg Chest 2  View  06/29/2015  CLINICAL DATA:  Week.  History of colon mass. EXAM: CHEST  2 VIEW COMPARISON:  None. FINDINGS: The heart size and mediastinal contours are within normal limits. Both lungs are clear. The visualized skeletal structures are unremarkable. IMPRESSION: No active cardiopulmonary disease. Electronically Signed   By: Edgar [name] M.D.   On: 06/29/2015 13:26   Ct Abdomen Pelvis W Contrast  06/28/2015  CLINICAL DATA:  Chronic lower abdominal pain and irregular constipation. Initial encounter. EXAM: CT ABDOMEN AND PELVIS WITH CONTRAST TECHNIQUE: Multidetector CT imaging of the abdomen and pelvis was performed using the standard protocol following bolus administration of intravenous contrast. CONTRAST:  110mL OMNIPAQUE IOHEXOL 300 MG/ML  SOLN COMPARISON:  None. FINDINGS: Minimal left basilar atelectasis is noted. The liver and spleen are unremarkable in appearance. The gallbladder is within normal limits. The pancreas and adrenal glands are unremarkable. Mild nonspecific perinephric stranding is noted bilaterally. The kidneys are otherwise unremarkable. No renal or ureteral stones are seen. There is no evidence of hydronephrosis. No free fluid is identified. The small bowel is unremarkable in appearance. The stomach is within normal limits. No acute vascular abnormalities are seen. Minimal calcification is noted at the distal abdominal aorta. The appendix is normal in caliber and contains air, without evidence of appendicitis. There is segmental circumferential wall thickening noted along the splenic flexure of the colon, measuring  approximately 6.9 x 4.6 cm, with surrounding soft tissue inflammation and trace fluid, and mild adjacent nodularity measuring up to 1.1 cm. A few mesenteric nodes are seen, measuring up to 6 mm in short axis. This is compatible with primary colonic malignancy. A nonspecific 1.0 cm nodule is noted lateral to the spleen. This may simply reflect a splenule. The bladder is  moderately distended and grossly unremarkable in appearance. The prostate remains normal in size. No inguinal lymphadenopathy is seen. No acute osseous abnormalities are identified. Vacuum phenomenon is noted at L5-S1. IMPRESSION: 1. 6.9 x 4.6 cm mass noted at the splenic flexure of the colon, with circumferential wall thickening, and surrounding soft tissue inflammation and trace fluid, compatible with prior right colonic malignancy. Mild adjacent nodularity measures up to 1.1 cm. Few mesenteric nodes seen, measuring up to 6 mm in short axis, likely reflecting nodal spread of disease. 2. Nonspecific 1.0 cm nodule noted lateral to the spleen. This may simply reflect a splenule. Electronically Signed   By: Edgar [name] M.D.   On: 06/28/2015 23:46    Admission HPI: Edgar [name] is a 54 year old African American man with no known medical history who does not regularly see a doctor, presenting with lower abdominal pain, constipation, pencil thin stools, bright red blood per rectum, melena, lightheadedness, odynophagia, and restless legs while sleeping.  His lower abdominal pain started 8 months ago; it is excruciatingly sharp, wraps around his back, and comes and goes randomly. It's been more and more frequent since the onset, and eventually became unbearable so he decided to come into the emergency department. 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 He's lost about 10 pounds in the last 3 months. He's also noted bright red blood per rectum and very dark stools which he thought were hemorrhoids, and his stools have been very dark. Additionally, he describes a feeling of food getting stuck in his chest since these symptoms began, which he attributes to mouth-breathing which dries out his mouth and food gets stuck. He denies any gastroesophageal reflux symptoms.  His restless legs while sleeping have been going on since this time as well which has been very bothersome. Besides these symptoms, he denies any cough, hemoptysis, difficulty urinating, focal back pain, or other complaints.  He came to the emergency department 4 months ago with these same symptoms and was found to have a hemoglobin of 8.4. He was discharged from the ED with resources to establish with a primary care doctor but he never followed up.   Prior to 4 months ago, he had not seen a doctor in years, and only for acute issues. He's never had a colonoscopy. He smokes about 1 pack of cigarettes per week for the last 20 years, and drinks about half a pint of liquor for 10 years but quit 8 months ago when his symptoms started. He's never used IV drugs or any other type of drugs before. There is no history of colorectal cancer or any other type of cancer in his family.  In the emergency department, he was tachycardic to 102, but slightly normotensive at 130/80, saturating 100% on room air. His basic labs showed a hemoglobin of 6.4 with an MCV of 60, and thrombophilia of 555. An abdominal CT showed a 7x5cm mass in the splenic flexure of  the colon with circumferential wall thickening and a few mesenteric nodes, likely reflecting nodal spread of disease. He was transfused 3U of PRBCs and admitted to IMTS.  Hospital Course by problem list: Principal Problem:   Anemia due to gastrointestinal blood loss Active Problems:   Hepatitis C   Cigarette smoker   Colonic mass   Symptomatic anemia   Malignant neoplasm of splenic flexure (HCC)   Dysphagia   Colonic mass and anemia due to GI blood loss: Patient presented with Hgb of 6.4 and MCV of 60, history of hematochezia, abdominal pain. CT abdomen showed a 7x5 cm mass at the splenic flexure of the colon. He was transfused 3 PRBCs with follow up Hgb of 9.3. Baseline CEA was 3.0. GI was consulted and colonoscopy on 11/23 showed a large malignant appearing  mass at the splenic flexure. Biopsies were taken. The scope could not be passed to evaluate beyond the mass. The area of colon distal to mass was normal appearing. Patient was without further bleeding or abdominal pain. Surgery was consulted and plan for outpatient follow up.  Dysphagia: EGD showed normal upper endoscopy without obvious cause for patient's dysphagia/odynophagia.  Discharge Vitals:   BP 124/78 mmHg  Pulse 80  Temp(Src) 98.2 F (36.8 C) (Oral)  Resp 18  Ht 6\' 3"  (1.905 m)  Wt 180 lb (81.647 kg)  BMI 22.50 kg/m2  SpO2 99%  Discharge Labs:  No results found for this or any previous visit (from the past 24 hour(s)).  Signed: Zada Finders, MD 07/01/2015, 1:03 PM    Services Ordered on Discharge: none Equipment Ordered on Discharge: none

## 2015-07-02 ENCOUNTER — Encounter (HOSPITAL_COMMUNITY): Payer: Self-pay | Admitting: Internal Medicine

## 2015-07-06 ENCOUNTER — Other Ambulatory Visit: Payer: Self-pay | Admitting: General Surgery

## 2015-07-06 NOTE — H&P (Signed)
Edgar [name] 07/06/2015 9:54 AM Location: [facility] Patient #: [id number] DOB: 10/03/1960 Married / Language: English / Race: Black or African American Male  History of Present Illness Edgar Ruff MD; [id number] 11:14 AM) Patient words: New- Colon Cancer.  The patient is a 54 year old male who presents with colorectal cancer. 54yo M who presented to the ED with abd pain, bloody stools and anemia. CT showed a splenic flexure mass. He underwent a colonoscopy in the hospital which showed a mass in the splenic flexure. Biopsy showed adenocarcinoma. CEA level was normal. CT scan also seems to show a mass in the right transverse colon. The patient was offered a colectomy this week if he stayed in the hospital but he decided to go home instead. He is here today for evaluation for surgery. He complains of abdominal cramping and constipation. He uses prune juice to have a bowel movement every 3-4 days. His symptoms have been progressively worsening since this summer. He does note blood in his stools. He was found to be anemic in the hospital and transfused several units of packed red blood cells.   Other Problems Edgar [name], CMA; 07/06/2015 11:27 AM) Hemorrhoids Colon Cancer Enlarged Prostate Hepatitis Transfusion history  Past Surgical History Edgar [name], CMA; 07/06/2015 11:27 AM) No pertinent past surgical history Colon Polyp Removal - Colonoscopy  Diagnostic Studies History Edgar [name], CMA; 07/06/2015 11:27 AM) Colonoscopy within last year  Allergies Edgar [name], CMA; 07/06/2015 9:55 AM) No Known Drug Allergies 07/06/2015  Medication History Edgar [name], CMA; 07/06/2015 9:55 AM) No Current Medications Medications Reconciled  Social History Edgar [name], CMA; [id number] AM) Illicit drug use Remotely quit drug use. No caffeine use Tobacco use Current some day smoker. Alcohol use Recently quit alcohol  use.  Family History Edgar [name], CMA; 07/06/2015 11:27 AM) Hypertension Father, Mother. Alcohol Abuse Father. Cerebrovascular Accident Father.     Review of Systems Edgar [name] CMA; 07/06/2015 11:27 AM) General Present- Fatigue. Not Present- Appetite Loss, Chills, Fever, Night Sweats, Weight Gain and Weight Loss. Skin Present- Rash. Not Present- Change in Wart/Mole, Dryness, Hives, Jaundice, New Lesions, Non-Healing Wounds and Ulcer. HEENT Present- Ringing in the Ears. Not Present- Earache, Hearing Loss, Hoarseness, Nose Bleed, Oral Ulcers, Seasonal Allergies, Sinus Pain, Sore Throat, Visual Disturbances, Wears glasses/contact lenses and Yellow Eyes. Cardiovascular Present- Shortness of Breath. Not Present- Chest Pain, Difficulty Breathing Lying Down, Leg Cramps, Palpitations, Rapid Heart Rate and Swelling of Extremities. Gastrointestinal Present- Abdominal Pain, Bloating, Bloody Stool, Change in Bowel Habits, Constipation, Excessive gas, Gets full quickly at meals, Hemorrhoids, Indigestion and Rectal Pain. Not Present- Chronic diarrhea, Difficulty Swallowing, Nausea and Vomiting. Male Genitourinary Present- Nocturia. Not Present- Blood in Urine, Change in Urinary Stream, Frequency, Impotence, Painful Urination, Urgency and Urine Leakage. Musculoskeletal Present- Joint Pain. Not Present- Back Pain, Joint Stiffness, Muscle Pain, Muscle Weakness and Swelling of Extremities. Psychiatric Present- Change in Sleep Pattern. Not Present- Anxiety, Bipolar, Depression, Fearful and Frequent crying. Endocrine Present- Cold Intolerance and Heat Intolerance. Not Present- Excessive Hunger, Hair Changes, Hot flashes and New Diabetes.  Vitals (Edgar [name] CMA; 07/06/2015 9:54 AM) 07/06/2015 9:54 AM Weight: 177.6 lb Height: 75in Body Surface Area: 2.09 m Body Mass Index: 22.2 kg/m  Temp.: 98.37F(Oral)  Pulse: 82 (Regular)  BP: 122/74 (Sitting, Left Arm,  Standard)      Physical Exam Edgar Ruff MD; [id number] 11:15 AM)  General Mental Status-Alert. General Appearance-Not in acute distress. Build & Nutrition-Well nourished. Posture-Normal posture. Gait-Normal.  Head and Neck  Head-normocephalic, atraumatic with no lesions or palpable masses. Trachea-midline.  Chest and Lung Exam Chest and lung exam reveals -on auscultation, normal breath sounds, no adventitious sounds and normal vocal resonance.  Cardiovascular Cardiovascular examination reveals -normal heart sounds, regular rate and rhythm with no murmurs.  Abdomen Inspection Inspection of the abdomen reveals - No Hernias. Palpation/Percussion Palpation and Percussion of the abdomen reveal - Soft, Non Tender, No Rigidity (guarding), No hepatosplenomegaly and No Palpable abdominal masses.  Neurologic Neurologic evaluation reveals -alert and oriented x 3 with no impairment of recent or remote memory, normal attention span and ability to concentrate, normal sensation and normal coordination.  Musculoskeletal Normal Exam - Bilateral-Upper Extremity Strength Normal and Lower Extremity Strength Normal.    Assessment & Plan Edgar Ruff MD; [id number] 11:18 AM)  MALIGNANT NEOPLASM OF SPLENIC FLEXURE (C18.5) Impression: 54 year old male with tumor at his splenic flexure. CT scan does show abutment of the tumor to the pancreas tail. CT scan also shows a possible tumor in the right transverse colon. This was discussed with radiology and confirmed. If he does have a tumor on the right side as well, I would recommend a total abdominal colectomy. We discussed the typical symptoms and side effects of an ileorectal anastomosis. We discussed that if we are not able to separate the splenic flexure tumor from the pancreatic tail, then he would also need a distal pancreatectomy and splenectomy. We discussed that we will attempt to do this using a minimally invasive  fashion, but the chances of having to convert to open are relatively high. I would like him to use MiraLAX on a daily basis until surgery. We have given him a bowel prep to complete the night before. The surgery and anatomy were described to the patient as well as the risks of surgery and the possible complications. These include: Bleeding, deep abdominal infections and possible wound complications such as hernia and infection, damage to adjacent structures, leak of surgical connections, which can lead to other surgeries and possibly an ostomy, possible need for other procedures, such as abscess drains in radiology, possible prolonged hospital stay, possible diarrhea from removal of part of the colon, possible constipation from narcotics, possible bowel, bladder or sexual dysfunction if having rectal surgery, prolonged fatigue/weakness or appetite loss, possible early recurrence of of disease, possible complications of their medical problems such as heart disease or arrhythmias or lung problems, death (less than 1%). I believe the patient understands and wishes to proceed with the surgery.

## 2015-07-09 ENCOUNTER — Encounter: Payer: Self-pay | Admitting: Internal Medicine

## 2015-07-09 ENCOUNTER — Ambulatory Visit (INDEPENDENT_AMBULATORY_CARE_PROVIDER_SITE_OTHER): Payer: Self-pay | Admitting: Internal Medicine

## 2015-07-09 VITALS — BP 131/76 | HR 90 | Temp 98.5°F | Ht 75.0 in | Wt 179.7 lb

## 2015-07-09 DIAGNOSIS — C185 Malignant neoplasm of splenic flexure: Secondary | ICD-10-CM

## 2015-07-09 DIAGNOSIS — D500 Iron deficiency anemia secondary to blood loss (chronic): Secondary | ICD-10-CM

## 2015-07-09 MED ORDER — FERROUS SULFATE 325 (65 FE) MG PO TBEC: 325.0000 mg | DELAYED_RELEASE_TABLET | Freq: Two times a day (BID) | ORAL | Status: DC

## 2015-07-09 MED ORDER — TRAMADOL HCL 50 MG PO TABS: 50.0000 mg | ORAL_TABLET | Freq: Four times a day (QID) | ORAL | Status: DC | PRN

## 2015-07-09 NOTE — Assessment & Plan Note (Addendum)
Assessment: Newly diagnosed CRC found after ED visit for weight loss, abdominal pain, and hematochezia. CT scan demonstrated 7x5cm colonic obstruction at the splenic flexture. He required 3 units pRBCs for acute on chronic blood loss anemia. Subsequent colonoscopy was inconclusive for right sided colonic involvement due to the obstruction at the left side. He continues to have intermittent pain related to intestinal obstruction from the mass.  Plan: He is now scheduled for pre-operative visit on 07/13/15 before a planned laparoscopic partial colectomy on 07/16/15. Tramadol for pain control

## 2015-07-09 NOTE — Patient Instructions (Addendum)
For your low blood count we recommend taking iron supplements twice daily with meals. We are also checking your blood count today and will call you with the results if it requires any additional treatment.  Otherwise we anticipate following up with you in a few weeks or as needed after your surgery

## 2015-07-09 NOTE — Progress Notes (Signed)
   Subjective:   Patient ID: Edgar [name] male   DOB: 1960-08-27 54 y.o.   MRN: [id number]  HPI: [name] is a 54 y.o. man with history of hepatitis C and recently diagnosed colorectal cancer who presents for hospital follow up. He was recently admitted as [facility] hospital for severe abdominal pain, hematochezia, and microcytic anemia. This was preceded by 6 months of progressive intermittent abdominal pain and 10lb unintentional weight loss. He has not routinely followed with physicians before this for many years. He denies any recurrent lightheadedness or chest pain after discharge from the hospital. He tolerates walking up to miles as needed but is occasionally limited by severe left sided abdominal pain.  See problem based assessment and plan below for additional details.   Past Medical History  Diagnosis Date  . Hepatitis C 1990s  . Microcytic anemia 02/2015  . Mass of colon 06/2015  . Malignant neoplasm of splenic flexure (HCC)    Current Outpatient Prescriptions  Medication Sig Dispense Refill  . traMADol (ULTRAM) 50 MG tablet Take 1 tablet (50 mg total) by mouth every 6 (six) hours as needed for severe pain. (Patient not taking: Reported on 07/09/2015) 15 tablet 0   No current facility-administered medications for this visit.   No family history on file. Social History   Social History  . Marital Status: Married    Spouse Name: N/A  . Number of Children: N/A  . Years of Education: N/A   Occupational History  . Cabin crew    Social History Main Topics  . Smoking status: Current Every Day Smoker -- 0.50 packs/day for 30 years    Types: Cigarettes  . Smokeless tobacco: Not on file  . Alcohol Use: Yes     Comment: socially  . Drug Use: Not on file  . Sexual Activity: Not on file   Other Topics Concern  . Not on file   Social History Narrative   Review of Systems: Review of Systems  Constitutional: Negative for fever and chills.    Respiratory: Negative for shortness of breath.   Cardiovascular: Negative for chest pain and palpitations.  Gastrointestinal: Positive for abdominal pain and constipation. Negative for nausea and vomiting.  Musculoskeletal: Negative for falls.  Neurological: Negative for dizziness, weakness and headaches.    Objective:  Physical Exam: Filed Vitals:   07/09/15 1518  BP: 131/76  Pulse: 90  Temp: 98.5 F (36.9 C)  TempSrc: Oral  Height: 6\' 3"  (1.905 m)  Weight: 179 lb 11.2 oz (81.511 kg)  SpO2: 100%   GENERAL- alert, fit appearing man in NAD HEENT- Atraumatic, oral mucosa appears moist, good and intact dentition. No carotid bruit, no cervical LN enlargement. CARDIAC- RRR, no murmurs, rubs or gallops. RESP- CTAB, no wheezes or crackles. ABDOMEN- Soft, nontender, no guarding or rebound, normoactive bowel sounds present EXTREMITIES- symmetric, no pedal edema. SKIN- Warm, dry, No rash or lesion. PSYCH- Normal mood and affect, appropriate thought content and speech.  Assessment & Plan:

## 2015-07-10 LAB — CBC
HEMOGLOBIN: 9.4 g/dL — AB (ref 12.6–17.7)
Hematocrit: 30 % — ABNORMAL LOW (ref 37.5–51.0)
MCH: 20.8 pg — AB (ref 26.6–33.0)
MCHC: 31.3 g/dL — ABNORMAL LOW (ref 31.5–35.7)
MCV: 66 fL — ABNORMAL LOW (ref 79–97)
PLATELETS: 386 10*3/uL — AB (ref 150–379)
RBC: 4.52 x10E6/uL (ref 4.14–5.80)
RDW: 28.8 % — ABNORMAL HIGH (ref 12.3–15.4)
WBC: 10.9 10*3/uL — AB (ref 3.4–10.8)

## 2015-07-10 NOTE — Assessment & Plan Note (Signed)
Assessment: Patient had severe blood loss anemia related to gastrointestinal cancer. Hemoglobin is now 9.4 after recent transfusion 3 units PRBCs in the hospital. Ferritin was 6, calculated iron deficit of greater than 2 g. Currently his diet still is limited to mostly liquids and soft solids due to colonic obstruction.  Plan: Start ferrous sulfate 325 mg by mouth twice a day

## 2015-07-13 ENCOUNTER — Encounter (HOSPITAL_COMMUNITY)
Admission: RE | Admit: 2015-07-13 | Discharge: 2015-07-13 | Disposition: A | Discharge status: Home / Self Care | Payer: Self-pay | Source: Ambulatory Visit | Attending: General Surgery | Admitting: General Surgery

## 2015-07-13 ENCOUNTER — Encounter (HOSPITAL_COMMUNITY): Payer: Self-pay

## 2015-07-13 DIAGNOSIS — C189 Malignant neoplasm of colon, unspecified: Secondary | ICD-10-CM | POA: Insufficient documentation

## 2015-07-13 DIAGNOSIS — Z01818 Encounter for other preprocedural examination: Secondary | ICD-10-CM | POA: Insufficient documentation

## 2015-07-13 HISTORY — DX: Personal history of other medical treatment: Z92.89

## 2015-07-13 LAB — COMPREHENSIVE METABOLIC PANEL
ALBUMIN: 3.8 g/dL (ref 3.5–5.0)
ALK PHOS: 80 U/L (ref 38–126)
ALT: 10 U/L — ABNORMAL LOW (ref 17–63)
AST: 14 U/L — AB (ref 15–41)
Anion gap: 7 (ref 5–15)
BILIRUBIN TOTAL: 0.2 mg/dL — AB (ref 0.3–1.2)
BUN: 15 mg/dL (ref 6–20)
CALCIUM: 9.2 mg/dL (ref 8.9–10.3)
CO2: 28 mmol/L (ref 22–32)
CREATININE: 1.05 mg/dL (ref 0.61–1.24)
Chloride: 105 mmol/L (ref 101–111)
GFR calc Af Amer: >60 mL/min (ref >60)
GFR calc non Af Amer: >60 mL/min (ref >60)
Glucose, Bld: 96 mg/dL (ref 65–99)
Potassium: 4.6 mmol/L (ref 3.5–5.1)
Sodium: 140 mmol/L (ref 135–145)
TOTAL PROTEIN: 7.4 g/dL (ref 6.5–8.1)

## 2015-07-13 LAB — CBC
HEMATOCRIT: 30.7 % — AB (ref 39.0–52.0)
HEMOGLOBIN: 9.4 g/dL — AB (ref 13.0–17.0)
MCH: 21 pg — ABNORMAL LOW (ref 26.0–34.0)
MCHC: 30.6 g/dL (ref 30.0–36.0)
MCV: 68.5 fL — ABNORMAL LOW (ref 78.0–100.0)
Platelets: 488 10*3/uL — ABNORMAL HIGH (ref 150–400)
RBC: 4.48 MIL/uL (ref 4.22–5.81)
RDW: 27.9 % — ABNORMAL HIGH (ref 11.5–15.5)
WBC: 9.4 10*3/uL (ref 4.0–10.5)

## 2015-07-13 NOTE — Patient Instructions (Addendum)
[place]  07/13/2015   Your procedure is scheduled on:  12-9 -2016 Friday  Enter through [facility] and follow signs to [facility] to [place]. Arrive at    0630    AM .  (Limit 1 person with you).  Call this number if you have problems the morning of surgery: [phone]  Or Presurgical Testing [phone] days before.   For Living Will and/or Health Care Power Attorney Forms: please provide copy for your medical record,may bring AM of surgery(Forms should be already notarized -we do not provide this service).(07-13-15  No information preferred today).  Remember: Follow any bowel prep instructions per MD office.   Do not eat food/ or drink: After Midnight.      Take these medicines the morning of surgery with A SIP OF WATER-   (DO NOT TAKE ANY DIABETIC MEDS AM OF SURGERY) : Tramadol-if need.   Do not wear jewelry, make-up or nail polish.  Do not wear deodorant, lotions, powders, or perfumes.   Do not shave legs and under arms- 48 hours(2 days) prior to first CHG shower.(Shaving face and neck okay.)  Do not bring valuables to the hospital.(Hospital is not responsible for lost valuables).  Contacts, dentures or removable bridgework, body piercing, hair pins may not be worn into surgery.  Leave suitcase in the car. After surgery it may be brought to your room.  For patients admitted to the hospital, checkout time is 11:00 AM the day of discharge.(Restricted visitors-Any Persons displaying flu-like symptoms or illness).    Patients discharged the day of surgery will not be allowed to drive home. Must have responsible person with you x 24 hours once discharged.  Name and phone number of your driver: Alta Corning, sister [phone] cell     Please read over the following fact sheets that you were given:  CHG(Chlorhexidine Gluconate 4% Surgical Soap) use.  Remember : Type/Screen "Blue armbands" - may not be removed once applied(would  result in being retested AM of surgery, if removed).         [place] - Preparing for Surgery Before surgery, you can play an important role.  Because skin is not sterile, your skin needs to be as free of germs as possible.  You can reduce the number of germs on your skin by washing with CHG (chlorahexidine gluconate) soap before surgery.  CHG is an antiseptic cleaner which kills germs and bonds with the skin to continue killing germs even after washing. Please DO NOT use if you have an allergy to CHG or antibacterial soaps.  If your skin becomes reddened/irritated stop using the CHG and inform your nurse when you arrive at Short Stay. Do not shave (including legs and underarms) for at least 48 hours prior to the first CHG shower.  You may shave your face/neck. Please follow these instructions carefully:  1.  Shower with CHG Soap the night before surgery and the  morning of Surgery.  2.  If you choose to wash your hair, wash your hair first as usual with your  normal  shampoo.  3.  After you shampoo, rinse your hair and body thoroughly to remove the  shampoo.                           4.  Use CHG as you would any other liquid soap.  You can apply chg directly  to the  skin and wash                       Gently with a scrungie or clean washcloth.  5.  Apply the CHG Soap to your body ONLY FROM THE NECK DOWN.   Do not use on face/ open                           Wound or open sores. Avoid contact with eyes, ears mouth and genitals (private parts).                       Wash face,  Genitals (private parts) with your normal soap.             6.  Wash thoroughly, paying special attention to the area where your surgery  will be performed.  7.  Thoroughly rinse your body with warm water from the neck down.  8.  DO NOT shower/wash with your normal soap after using and rinsing off  the CHG Soap.                9.  Pat yourself dry with a clean towel.            10.  Wear clean pajamas.            11.   Place clean sheets on your bed the night of your first shower and do not  sleep with pets. Day of Surgery : Do not apply any lotions/deodorants the morning of surgery.  Please wear clean clothes to the hospital/surgery center.  FAILURE TO FOLLOW THESE INSTRUCTIONS MAY RESULT IN THE CANCELLATION OF YOUR SURGERY PATIENT SIGNATURE_________________________________  NURSE SIGNATURE__________________________________  ________________________________________________________________________

## 2015-07-13 NOTE — Progress Notes (Signed)
Internal Medicine Clinic Attending  I saw and evaluated the patient.  I personally confirmed the key portions of the history and exam documented by Dr. Rice and I reviewed pertinent patient test results.  The assessment, diagnosis, and plan were formulated together and I agree with the documentation in the resident's note.  

## 2015-07-14 LAB — HEMOGLOBIN A1C
HEMOGLOBIN A1C: 5.9 % — AB (ref 4.8–5.6)
MEAN PLASMA GLUCOSE: 123 mg/dL

## 2015-07-16 ENCOUNTER — Inpatient Hospital Stay (HOSPITAL_COMMUNITY)
Admission: RE | Admit: 2015-07-16 | Discharge: 2015-07-23 | DRG: 330 | Disposition: A | Discharge status: Home / Self Care | Payer: Self-pay | Source: Ambulatory Visit | Attending: General Surgery | Admitting: General Surgery

## 2015-07-16 ENCOUNTER — Inpatient Hospital Stay (HOSPITAL_COMMUNITY): Payer: Self-pay | Admitting: Anesthesiology

## 2015-07-16 ENCOUNTER — Encounter (HOSPITAL_COMMUNITY)
Admission: RE | Disposition: A | Discharge status: Home / Self Care | Payer: Self-pay | Source: Ambulatory Visit | Attending: General Surgery

## 2015-07-16 ENCOUNTER — Encounter (HOSPITAL_COMMUNITY): Payer: Self-pay | Admitting: *Deleted

## 2015-07-16 DIAGNOSIS — Z01812 Encounter for preprocedural laboratory examination: Secondary | ICD-10-CM

## 2015-07-16 DIAGNOSIS — N400 Enlarged prostate without lower urinary tract symptoms: Secondary | ICD-10-CM | POA: Diagnosis present

## 2015-07-16 DIAGNOSIS — K759 Inflammatory liver disease, unspecified: Secondary | ICD-10-CM | POA: Diagnosis present

## 2015-07-16 DIAGNOSIS — K649 Unspecified hemorrhoids: Secondary | ICD-10-CM | POA: Diagnosis present

## 2015-07-16 DIAGNOSIS — Z86010 Personal history of colonic polyps: Secondary | ICD-10-CM

## 2015-07-16 DIAGNOSIS — D649 Anemia, unspecified: Secondary | ICD-10-CM | POA: Diagnosis present

## 2015-07-16 DIAGNOSIS — Z8249 Family history of ischemic heart disease and other diseases of the circulatory system: Secondary | ICD-10-CM

## 2015-07-16 DIAGNOSIS — Z811 Family history of alcohol abuse and dependence: Secondary | ICD-10-CM

## 2015-07-16 DIAGNOSIS — K567 Ileus, unspecified: Secondary | ICD-10-CM | POA: Diagnosis not present

## 2015-07-16 DIAGNOSIS — C185 Malignant neoplasm of splenic flexure: Principal | ICD-10-CM | POA: Diagnosis present

## 2015-07-16 HISTORY — PX: LAPAROSCOPIC PARTIAL COLECTOMY: SHX5907

## 2015-07-16 LAB — TYPE AND SCREEN
ABO/RH(D): A POS
ANTIBODY SCREEN: NEGATIVE

## 2015-07-16 LAB — PROTIME-INR
INR: 1.06 (ref 0.00–1.49)
PROTHROMBIN TIME: 14 s (ref 11.6–15.2)

## 2015-07-16 LAB — ABO/RH: ABO/RH(D): A POS

## 2015-07-16 SURGERY — LAPAROSCOPIC PARTIAL COLECTOMY
Anesthesia: General | Site: Abdomen

## 2015-07-16 MED ORDER — BUPIVACAINE-EPINEPHRINE 0.25% -1:200000 IJ SOLN
INTRAMUSCULAR | Status: DC | PRN
  Administered 2015-07-16: 15 mL

## 2015-07-16 MED ORDER — PHENYLEPHRINE 40 MCG/ML (10ML) SYRINGE FOR IV PUSH (FOR BLOOD PRESSURE SUPPORT)
PREFILLED_SYRINGE | INTRAVENOUS | Status: AC
  Filled 2015-07-16: qty 10

## 2015-07-16 MED ORDER — ENOXAPARIN SODIUM 40 MG/0.4ML ~~LOC~~ SOLN
40.0000 mg | SUBCUTANEOUS | Status: DC
  Administered 2015-07-17 – 2015-07-23 (×7): 40 mg via SUBCUTANEOUS
  Filled 2015-07-16 (×10): qty 0.4

## 2015-07-16 MED ORDER — HYDROMORPHONE HCL 1 MG/ML IJ SOLN
INTRAMUSCULAR | Status: AC
  Filled 2015-07-16: qty 2

## 2015-07-16 MED ORDER — HYDROMORPHONE HCL 1 MG/ML IJ SOLN
0.2500 mg | INTRAMUSCULAR | Status: DC | PRN
  Administered 2015-07-16: 0.5 mg via INTRAVENOUS
  Administered 2015-07-16 (×2): 0.25 mg via INTRAVENOUS

## 2015-07-16 MED ORDER — ROCURONIUM BROMIDE 100 MG/10ML IV SOLN
INTRAVENOUS | Status: AC
  Filled 2015-07-16: qty 1

## 2015-07-16 MED ORDER — HEPARIN SODIUM (PORCINE) 5000 UNIT/ML IJ SOLN
5000.0000 [IU] | Freq: Once | INTRAMUSCULAR | Status: AC
  Administered 2015-07-16: 5000 [IU] via SUBCUTANEOUS
  Filled 2015-07-16: qty 1

## 2015-07-16 MED ORDER — DEXTROSE 5 % IV SOLN
2.0000 g | INTRAVENOUS | Status: AC
  Administered 2015-07-16: 2 g via INTRAVENOUS

## 2015-07-16 MED ORDER — BUPIVACAINE-EPINEPHRINE (PF) 0.25% -1:200000 IJ SOLN
INTRAMUSCULAR | Status: AC
  Filled 2015-07-16: qty 30

## 2015-07-16 MED ORDER — DIPHENHYDRAMINE HCL 50 MG/ML IJ SOLN
25.0000 mg | Freq: Four times a day (QID) | INTRAMUSCULAR | Status: DC | PRN
  Administered 2015-07-20: 25 mg via INTRAVENOUS
  Filled 2015-07-16: qty 1

## 2015-07-16 MED ORDER — KCL IN DEXTROSE-NACL 20-5-0.45 MEQ/L-%-% IV SOLN
INTRAVENOUS | Status: DC
  Administered 2015-07-16: 125 mL/h via INTRAVENOUS
  Administered 2015-07-17 – 2015-07-20 (×4): via INTRAVENOUS
  Administered 2015-07-21: 75 mL/h via INTRAVENOUS
  Administered 2015-07-21: via INTRAVENOUS
  Filled 2015-07-16 (×16): qty 1000

## 2015-07-16 MED ORDER — DEXAMETHASONE SODIUM PHOSPHATE 10 MG/ML IJ SOLN
INTRAMUSCULAR | Status: DC | PRN
  Administered 2015-07-16: 10 mg via INTRAVENOUS

## 2015-07-16 MED ORDER — LIP MEDEX EX OINT
TOPICAL_OINTMENT | CUTANEOUS | Status: AC
  Administered 2015-07-16: 17:00:00
  Filled 2015-07-16: qty 7

## 2015-07-16 MED ORDER — GLYCOPYRROLATE 0.2 MG/ML IJ SOLN
INTRAMUSCULAR | Status: DC | PRN
  Administered 2015-07-16: 0.6 mg via INTRAVENOUS

## 2015-07-16 MED ORDER — ACETAMINOPHEN 500 MG PO TABS
1000.0000 mg | ORAL_TABLET | Freq: Four times a day (QID) | ORAL | Status: AC
  Administered 2015-07-16 – 2015-07-17 (×4): 1000 mg via ORAL
  Filled 2015-07-16 (×4): qty 2

## 2015-07-16 MED ORDER — LABETALOL HCL 5 MG/ML IV SOLN
INTRAVENOUS | Status: DC | PRN
  Administered 2015-07-16 (×2): 5 mg via INTRAVENOUS

## 2015-07-16 MED ORDER — LIDOCAINE HCL (CARDIAC) 20 MG/ML IV SOLN
INTRAVENOUS | Status: AC
  Filled 2015-07-16: qty 5

## 2015-07-16 MED ORDER — PROPOFOL 10 MG/ML IV BOLUS
INTRAVENOUS | Status: DC | PRN
  Administered 2015-07-16: 20 mg via INTRAVENOUS
  Administered 2015-07-16: 180 mg via INTRAVENOUS

## 2015-07-16 MED ORDER — MIDAZOLAM HCL 2 MG/2ML IJ SOLN
INTRAMUSCULAR | Status: AC
  Filled 2015-07-16: qty 2

## 2015-07-16 MED ORDER — FENTANYL CITRATE (PF) 250 MCG/5ML IJ SOLN
INTRAMUSCULAR | Status: DC | PRN
  Administered 2015-07-16 (×2): 50 ug via INTRAVENOUS
  Administered 2015-07-16: 100 ug via INTRAVENOUS
  Administered 2015-07-16 (×3): 50 ug via INTRAVENOUS

## 2015-07-16 MED ORDER — HYDROMORPHONE HCL 2 MG/ML IJ SOLN
INTRAMUSCULAR | Status: AC
  Filled 2015-07-16: qty 1

## 2015-07-16 MED ORDER — KETAMINE HCL 10 MG/ML IJ SOLN
INTRAMUSCULAR | Status: DC | PRN
  Administered 2015-07-16: 25 mg via INTRAVENOUS
  Administered 2015-07-16 (×2): 20 mg via INTRAVENOUS

## 2015-07-16 MED ORDER — DEXAMETHASONE SODIUM PHOSPHATE 10 MG/ML IJ SOLN
INTRAMUSCULAR | Status: AC
  Filled 2015-07-16: qty 1

## 2015-07-16 MED ORDER — NEOSTIGMINE METHYLSULFATE 10 MG/10ML IV SOLN
INTRAVENOUS | Status: AC
  Filled 2015-07-16: qty 1

## 2015-07-16 MED ORDER — GLYCOPYRROLATE 0.2 MG/ML IJ SOLN
INTRAMUSCULAR | Status: AC
  Filled 2015-07-16: qty 3

## 2015-07-16 MED ORDER — ALVIMOPAN 12 MG PO CAPS
12.0000 mg | ORAL_CAPSULE | Freq: Once | ORAL | Status: AC
  Administered 2015-07-16: 12 mg via ORAL
  Filled 2015-07-16: qty 1

## 2015-07-16 MED ORDER — PHENYLEPHRINE HCL 10 MG/ML IJ SOLN
INTRAMUSCULAR | Status: DC | PRN
  Administered 2015-07-16 (×3): 80 ug via INTRAVENOUS
  Administered 2015-07-16: 120 ug via INTRAVENOUS

## 2015-07-16 MED ORDER — ONDANSETRON HCL 4 MG/2ML IJ SOLN
INTRAMUSCULAR | Status: AC
  Filled 2015-07-16: qty 2

## 2015-07-16 MED ORDER — LACTATED RINGERS IV SOLN
INTRAVENOUS | Status: DC
  Administered 2015-07-16: 1000 mL via INTRAVENOUS
  Administered 2015-07-16 (×3): via INTRAVENOUS

## 2015-07-16 MED ORDER — LACTATED RINGERS IR SOLN
Status: DC | PRN
  Administered 2015-07-16: 1000 mL

## 2015-07-16 MED ORDER — ALVIMOPAN 12 MG PO CAPS
12.0000 mg | ORAL_CAPSULE | Freq: Two times a day (BID) | ORAL | Status: DC
  Administered 2015-07-17 – 2015-07-18 (×3): 12 mg via ORAL
  Filled 2015-07-16 (×4): qty 1

## 2015-07-16 MED ORDER — DEXTROSE 5 % IV SOLN
2.0000 g | Freq: Two times a day (BID) | INTRAVENOUS | Status: AC
  Administered 2015-07-16: 2 g via INTRAVENOUS
  Filled 2015-07-16: qty 2

## 2015-07-16 MED ORDER — ONDANSETRON HCL 4 MG/2ML IJ SOLN
INTRAMUSCULAR | Status: DC | PRN
  Administered 2015-07-16: 4 mg via INTRAVENOUS

## 2015-07-16 MED ORDER — DIPHENHYDRAMINE HCL 25 MG PO CAPS: 25.0000 mg | ORAL_CAPSULE | Freq: Four times a day (QID) | ORAL | Status: DC | PRN

## 2015-07-16 MED ORDER — PROMETHAZINE HCL 25 MG/ML IJ SOLN
INTRAMUSCULAR | Status: AC
  Filled 2015-07-16: qty 1

## 2015-07-16 MED ORDER — NEOSTIGMINE METHYLSULFATE 10 MG/10ML IV SOLN
INTRAVENOUS | Status: DC | PRN
  Administered 2015-07-16: 4 mg via INTRAVENOUS

## 2015-07-16 MED ORDER — FENTANYL CITRATE (PF) 250 MCG/5ML IJ SOLN
INTRAMUSCULAR | Status: AC
  Filled 2015-07-16: qty 5

## 2015-07-16 MED ORDER — LACTATED RINGERS IV SOLN: INTRAVENOUS | Status: DC

## 2015-07-16 MED ORDER — CEFOTETAN DISODIUM-DEXTROSE 2-2.08 GM-% IV SOLR
INTRAVENOUS | Status: AC
  Filled 2015-07-16: qty 50

## 2015-07-16 MED ORDER — PROPOFOL 10 MG/ML IV BOLUS
INTRAVENOUS | Status: AC
  Filled 2015-07-16: qty 20

## 2015-07-16 MED ORDER — 0.9 % SODIUM CHLORIDE (POUR BTL) OPTIME
TOPICAL | Status: DC | PRN
  Administered 2015-07-16: 2000 mL

## 2015-07-16 MED ORDER — ROCURONIUM BROMIDE 100 MG/10ML IV SOLN
INTRAVENOUS | Status: DC | PRN
  Administered 2015-07-16: 5 mg via INTRAVENOUS
  Administered 2015-07-16: 20 mg via INTRAVENOUS
  Administered 2015-07-16: 30 mg via INTRAVENOUS
  Administered 2015-07-16 (×3): 10 mg via INTRAVENOUS

## 2015-07-16 MED ORDER — LABETALOL HCL 5 MG/ML IV SOLN
INTRAVENOUS | Status: AC
  Filled 2015-07-16: qty 4

## 2015-07-16 MED ORDER — MIDAZOLAM HCL 5 MG/5ML IJ SOLN
INTRAMUSCULAR | Status: DC | PRN
  Administered 2015-07-16: 2 mg via INTRAVENOUS

## 2015-07-16 MED ORDER — LIDOCAINE HCL (CARDIAC) 20 MG/ML IV SOLN
INTRAVENOUS | Status: DC | PRN
  Administered 2015-07-16: 100 mg via INTRAVENOUS

## 2015-07-16 MED ORDER — FENTANYL CITRATE (PF) 100 MCG/2ML IJ SOLN
INTRAMUSCULAR | Status: AC
  Filled 2015-07-16: qty 2

## 2015-07-16 MED ORDER — HYDROMORPHONE HCL 1 MG/ML IJ SOLN
INTRAMUSCULAR | Status: DC | PRN
  Administered 2015-07-16: .2 mg via INTRAVENOUS
  Administered 2015-07-16: .4 mg via INTRAVENOUS
  Administered 2015-07-16 (×2): .2 mg via INTRAVENOUS
  Administered 2015-07-16: .4 mg via INTRAVENOUS
  Administered 2015-07-16 (×3): .2 mg via INTRAVENOUS

## 2015-07-16 MED ORDER — MORPHINE SULFATE (PF) 2 MG/ML IV SOLN
2.0000 mg | INTRAVENOUS | Status: DC | PRN
  Administered 2015-07-16 (×3): 2 mg via INTRAVENOUS
  Administered 2015-07-16 – 2015-07-17 (×6): 4 mg via INTRAVENOUS
  Filled 2015-07-16: qty 2
  Filled 2015-07-16: qty 1
  Filled 2015-07-16 (×6): qty 2
  Filled 2015-07-16: qty 1

## 2015-07-16 SURGICAL SUPPLY — 84 items
APPLIER CLIP 5 13 M/L LIGAMAX5 (MISCELLANEOUS)
APR CLP MED LRG 5 ANG JAW (MISCELLANEOUS)
BLADE EXTENDED COATED 6.5IN (ELECTRODE) ×3 IMPLANT
BRR ADH 5X3 SEPRAFILM 6 SHT (MISCELLANEOUS) ×1
CABLE HIGH FREQUENCY MONO STRZ (ELECTRODE) IMPLANT
CELLS DAT CNTRL 66122 CELL SVR (MISCELLANEOUS) IMPLANT
CHLORAPREP W/TINT 26ML (MISCELLANEOUS) ×3 IMPLANT
CLIP APPLIE 5 13 M/L LIGAMAX5 (MISCELLANEOUS) IMPLANT
COVER MAYO STAND STRL (DRAPES) ×9 IMPLANT
COVER SURGICAL LIGHT HANDLE (MISCELLANEOUS) ×3 IMPLANT
DECANTER SPIKE VIAL GLASS SM (MISCELLANEOUS) ×3 IMPLANT
DRAIN CHANNEL 19F RND (DRAIN) IMPLANT
DRAPE LAPAROSCOPIC ABDOMINAL (DRAPES) ×3 IMPLANT
DRAPE SURG IRRIG POUCH 19X23 (DRAPES) ×3 IMPLANT
DRSG OPSITE POSTOP 4X10 (GAUZE/BANDAGES/DRESSINGS) IMPLANT
DRSG OPSITE POSTOP 4X6 (GAUZE/BANDAGES/DRESSINGS) IMPLANT
DRSG OPSITE POSTOP 4X8 (GAUZE/BANDAGES/DRESSINGS) ×3 IMPLANT
ELECT PENCIL ROCKER SW 15FT (MISCELLANEOUS) ×8 IMPLANT
ELECT REM PT RETURN 9FT ADLT (ELECTROSURGICAL) ×3
ELECTRODE REM PT RTRN 9FT ADLT (ELECTROSURGICAL) ×1 IMPLANT
ENDOLOOP SUT PDS II  0 18 (SUTURE)
ENDOLOOP SUT PDS II 0 18 (SUTURE) IMPLANT
EVACUATOR SILICONE 100CC (DRAIN) IMPLANT
GAUZE SPONGE 4X4 12PLY STRL (GAUZE/BANDAGES/DRESSINGS) IMPLANT
GLOVE BIO SURGEON STRL SZ 6.5 (GLOVE) ×8 IMPLANT
GLOVE BIO SURGEONS STRL SZ 6.5 (GLOVE) ×4
GLOVE BIOGEL PI IND STRL 7.0 (GLOVE) ×2 IMPLANT
GLOVE BIOGEL PI INDICATOR 7.0 (GLOVE) ×12
GOWN STRL REUS W/TWL 2XL LVL3 (GOWN DISPOSABLE) ×6 IMPLANT
GOWN STRL REUS W/TWL XL LVL3 (GOWN DISPOSABLE) ×16 IMPLANT
HANDLE STAPLE EGIA 4 XL (STAPLE) ×3 IMPLANT
LEGGING LITHOTOMY PAIR STRL (DRAPES) IMPLANT
LIGASURE IMPACT 36 18CM CVD LR (INSTRUMENTS) IMPLANT
LIQUID BAND (GAUZE/BANDAGES/DRESSINGS) ×2 IMPLANT
PACK COLON (CUSTOM PROCEDURE TRAY) ×3 IMPLANT
PAD POSITIONING PINK XL (MISCELLANEOUS) ×3 IMPLANT
PORT LAP GEL ALEXIS MED 5-9CM (MISCELLANEOUS) ×3 IMPLANT
RELOAD EGIA 60 MED/THCK PURPLE (STAPLE) ×6 IMPLANT
RELOAD PROXIMATE 75MM BLUE (ENDOMECHANICALS) ×3 IMPLANT
RELOAD STAPLE 60 MED/THCK ART (STAPLE) IMPLANT
RELOAD STAPLE 75 3.8 BLU REG (ENDOMECHANICALS) IMPLANT
RETRACTOR WND ALEXIS 18 MED (MISCELLANEOUS) IMPLANT
RTRCTR WOUND ALEXIS 18CM MED (MISCELLANEOUS)
SCISSORS LAP 5X35 DISP (ENDOMECHANICALS) ×3 IMPLANT
SEALER TISSUE G2 STRG ARTC 35C (ENDOMECHANICALS) ×2 IMPLANT
SEPRAFILM PROCEDURAL PACK 3X5 (MISCELLANEOUS) ×2 IMPLANT
SET IRRIG TUBING LAPAROSCOPIC (IRRIGATION / IRRIGATOR) ×3 IMPLANT
SLEEVE XCEL OPT CAN 5 100 (ENDOMECHANICALS) ×11 IMPLANT
SPONGE DRAIN TRACH 4X4 STRL 2S (GAUZE/BANDAGES/DRESSINGS) ×3 IMPLANT
SPONGE LAP 18X18 X RAY DECT (DISPOSABLE) IMPLANT
STAPLER CIRC ILS CVD 25MM (STAPLE) ×4 IMPLANT
STAPLER PROXIMATE 75MM BLUE (STAPLE) ×3 IMPLANT
STAPLER VISISTAT 35W (STAPLE) ×3 IMPLANT
SUCTION POOLE TIP (SUCTIONS) ×2 IMPLANT
SUT ETHILON 2 0 PS N (SUTURE) IMPLANT
SUT NOVA 1 T20/GS 25DT (SUTURE) IMPLANT
SUT NOVA NAB DX-16 0-1 5-0 T12 (SUTURE) ×4 IMPLANT
SUT PDS AB 1 CTX 36 (SUTURE) IMPLANT
SUT PDS AB 1 TP1 96 (SUTURE) IMPLANT
SUT PROLENE 2 0 KS (SUTURE) IMPLANT
SUT SILK 2 0 (SUTURE) ×3
SUT SILK 2 0 SH CR/8 (SUTURE) ×3 IMPLANT
SUT SILK 2-0 18XBRD TIE 12 (SUTURE) ×1 IMPLANT
SUT SILK 3 0 (SUTURE) ×3
SUT SILK 3 0 SH CR/8 (SUTURE) ×3 IMPLANT
SUT SILK 3-0 18XBRD TIE 12 (SUTURE) ×1 IMPLANT
SUT VIC AB 2-0 SH 18 (SUTURE) ×2 IMPLANT
SUT VIC AB 4-0 PS2 18 (SUTURE) IMPLANT
SUT VIC AB 4-0 PS2 27 (SUTURE) IMPLANT
SUT VICRYL 0 UR6 27IN ABS (SUTURE) ×2 IMPLANT
SUT VICRYL 2 0 18  UND BR (SUTURE)
SUT VICRYL 2 0 18 UND BR (SUTURE) IMPLANT
SYS LAPSCP GELPORT 120MM (MISCELLANEOUS)
SYSTEM LAPSCP GELPORT 120MM (MISCELLANEOUS) IMPLANT
TOWEL OR 17X26 10 PK STRL BLUE (TOWEL DISPOSABLE) IMPLANT
TOWEL OR NON WOVEN STRL DISP B (DISPOSABLE) IMPLANT
TRAY FOLEY W/METER SILVER 14FR (SET/KITS/TRAYS/PACK) ×3 IMPLANT
TRAY FOLEY W/METER SILVER 16FR (SET/KITS/TRAYS/PACK) ×3 IMPLANT
TROCAR BLADELESS OPT 5 100 (ENDOMECHANICALS) ×3 IMPLANT
TROCAR XCEL 12X100 BLDLESS (ENDOMECHANICALS) ×6 IMPLANT
TROCAR XCEL BLUNT TIP 100MML (ENDOMECHANICALS) IMPLANT
TROCAR XCEL NON-BLD 11X100MML (ENDOMECHANICALS) ×2 IMPLANT
TUBING FILTER THERMOFLATOR (ELECTROSURGICAL) ×3 IMPLANT
TUBING INSUFFLATION 10FT LAP (TUBING) ×3 IMPLANT

## 2015-07-16 NOTE — Interval H&P Note (Signed)
History and Physical Interval Note:  07/16/2015 8:33 AM  Edgar [name]  has presented today for surgery, with the diagnosis of colon cancer  The various methods of treatment have been discussed with the patient and family. After consideration of risks, benefits and other options for treatment, the patient has consented to  Procedure(s): LAPAROSCOPIC PARTIAL COLECTOMY POSSIBLE OPEN POSSIBLE TOTAL ABDOMINAL COLECTOMY POSSIBLE DISTAL PANCREATECTOMY AND SPLENECTOMY (N/A) as a surgical intervention .  The patient's history has been reviewed, patient examined, no change in status, stable for surgery.  I have reviewed the patient's chart and labs.  Questions were answered to the patient's satisfaction.     Rosario Adie, MD  [facility]

## 2015-07-16 NOTE — Anesthesia Postprocedure Evaluation (Signed)
Anesthesia Post Note  Patient: Edgar [name]  Procedure(s) Performed: Procedure(s) (LRB): LAPAROSCOPIC TOTAL ABDOMINAL COLECTOMY  (N/A)  Patient location during evaluation: PACU Anesthesia Type: General Level of consciousness: awake and alert Pain management: pain level controlled Vital Signs Assessment: post-procedure vital signs reviewed and stable Respiratory status: spontaneous breathing, nonlabored ventilation, respiratory function stable and patient connected to nasal cannula oxygen Cardiovascular status: blood pressure returned to baseline and stable Postop Assessment: no signs of nausea or vomiting Anesthetic complications: no    Last Vitals:  Filed Vitals:   07/16/15 1454 07/16/15 1510  BP: 133/84 122/76  Pulse: 93 77  Temp:  36.7 C  Resp: 14 14    Last Pain:  Filed Vitals:   07/16/15 1520  PainSc: 9                  Zeki Bedrosian L

## 2015-07-16 NOTE — Anesthesia Procedure Notes (Signed)
Procedure Name: Intubation Date/Time: 07/16/2015 9:29 AM Performed by: Dione Booze Pre-anesthesia Checklist: Emergency Drugs available, Suction available, Patient being monitored and Patient identified Patient Re-evaluated:Patient Re-evaluated prior to inductionOxygen Delivery Method: Circle system utilized Preoxygenation: Pre-oxygenation with 100% oxygen Intubation Type: IV induction Laryngoscope Size: Miller and 2 Grade View: Grade II Tube type: Oral Tube size: 7.5 mm Number of attempts: 1 Airway Equipment and Method: Stylet Placement Confirmation: ETT inserted through vocal cords under direct vision and positive ETCO2 Secured at: 23 cm Tube secured with: Tape Dental Injury: Teeth and Oropharynx as per pre-operative assessment

## 2015-07-16 NOTE — Transfer of Care (Signed)
Immediate Anesthesia Transfer of Care Note  Patient: Edgar [name]  Procedure(s) Performed: Procedure(s): LAPAROSCOPIC TOTAL ABDOMINAL COLECTOMY  (N/A)  Patient Location: PACU  Anesthesia Type:General  Level of Consciousness: awake, oriented and patient cooperative  Airway & Oxygen Therapy: Patient Spontanous Breathing and Patient connected to face mask oxygen  Post-op Assessment: Report given to RN and Post -op Vital signs reviewed and stable  Post vital signs: Reviewed and stable  Last Vitals:  Filed Vitals:   07/16/15 0707  BP: 130/87  Pulse: 110  Temp: 36.9 C  Resp: 18    Complications: No apparent anesthesia complications

## 2015-07-16 NOTE — Op Note (Signed)
07/16/2015  1:36 PM  PATIENT:  Edgar [name]  54 y.o. male  Patient Care Team: No Pcp Per Patient as PCP - General (General Practice)  PRE-OPERATIVE DIAGNOSIS:  colon cancer  POST-OPERATIVE DIAGNOSIS:  colon cancer  PROCEDURE:   LAPAROSCOPIC TOTAL ABDOMINAL COLECTOMY    Surgeon(s): Jackolyn Confer, MD [name], MD  ASSISTANT: Zella Richer   ANESTHESIA:   local and general  EBL:  Total I/O In: 3500 [I.V.:3500] Out: 320 [Urine:120; Blood:200]  Delay start of Pharmacological VTE agent (>24hrs) due to surgical blood loss or risk of bleeding:  no  DRAINS: (83F) Jackson-Pratt drain(s) with closed bulb suction in the LUQ   SPECIMEN:  Source of Specimen:  L colon and transverse, R colon and transverse  DISPOSITION OF SPECIMEN:  PATHOLOGY  COUNTS:  YES  PLAN OF CARE: Admit to inpatient   PATIENT DISPOSITION:  PACU - hemodynamically stable.  INDICATION:    54 y.o. M r who presents with a obstructing splenic flexure colon cancer.  CT scan also showed a possible tumor in the right transverse colon as well. I recommended segmental resection and possible total colectomy if his right-sided mass was palpated.  The anatomy & physiology of the digestive tract was discussed.  The pathophysiology was discussed.  Natural history risks without surgery was discussed.   I worked to give an overview of the disease and the frequent need to have multispecialty involvement.  I feel the risks of no intervention will lead to serious problems that outweigh the operative risks; therefore, I recommended a partial colectomy to remove the pathology.  Laparoscopic & open techniques were discussed.   Risks such as bleeding, infection, abscess, leak, reoperation, possible ostomy, hernia, heart attack, death, and other risks were discussed.  I noted a good likelihood this will help address the problem.   Goals of post-operative recovery were discussed as well.    The patient expressed understanding  & wished to proceed with surgery.  OR FINDINGS:   Patient had bulky splenic flexure mass somewhat adherent to the inferior pancreatic border.  There was a palpable mass in the right colon that felt like a large polyp. There was some nodularity noted that was concerning for invasion. It was elected to proceed with total abdominal colectomy to address both masses.  No obvious metastatic disease on visceral parietal peritoneum or liver.   DESCRIPTION:   Informed consent was confirmed.  The patient underwent general anaesthesia without difficulty.  The patient was positioned appropriately.  VTE prevention in place.  The patient's abdomen was clipped, prepped, & draped in a sterile fashion.  Surgical timeout confirmed our plan.  The patient was positioned in reverse Trendelenburg.  Abdominal entry was gained using open technique and the superior umbilical fold.  Entry was clean.  I induced carbon dioxide insufflation.  Camera inspection revealed no injury.  Extra ports were carefully placed under direct laparoscopic visualization.  After the ports were placed, I evaluated the entire abdomen. There is no signs of metastatic disease. I evaluated the right transverse colon for any signs of recurrent tumor. I did not palpate any laparoscopically. I decided to take down the splenic flexure to began. I separated the omentum from the edge of the colon using a laparoscopic Enseal device. I then divided the splenocolic ligament and gastrocolic ligament to free the tumor from the surrounding structures. The tumor abutted the inferior pancreatic border. I bluntly mobilized this off of the border making careful not to use any cautery or heat  around the pancreas. Once I freed the splenic flexure completely I terminated attention to the rectosigmoid junction. Patient was placed in reverse Trendelenburg and we elevated the sigmoid colon out of the pelvis. I dissected into the mesorectal plane between the mesentery and  the sacrum. I identified what appeared to be the left ureter in its normal anatomic position. I went above the retroperitoneal plane and bluntly dissected the remaining portion of the colon off of this. Once this was completely free, I did isolated IMA pedicle but did not ligate it yet.  I continued distally and got into the avascular plane posterior to the mesorectum. This allowed me to help mobilize the rectum as well by freeing the mesorectum off the sacrum.  I mobilized the peritoneal coverings towards the peritoneal reflection on both the right and left sides of the rectum.  I could see the right and left ureters and stayed away from them.    I skeletonized the inferior mesenteric artery pedicle.  I went down to its takeoff from the aorta.   I isolated the inferior mesenteric vein off of the ligament of Treitz just cephalad to that as well.  After confirming the left ureter was out of the way, I went ahead and ligated the inferior mesenteric artery pedicle with bipolar EnSeal ~2cm above its takeoff from the aorta.   I did ligate the inferior mesenteric vein in a similar fashion.  We ensured hemostasis. I skeletonized the mesorectum at the junction at the proximal rectum using blunt dissection & bipolar EnSeal.  I mobilized the left colon in a lateral to medial fashion off the line of Toldt up towards the splenic flexure to ensure good mobilization of the left colon to reach into the pelvis.  I then mobilized the colon off of the retroperitoneal attachments as well.  I then made a periumbilical incision approximately the size of my hand and placed Alexis wound protector. We brought out the colon through this site. The transverse colon was transected just distal to the middle colic artery with a 75 mm GIA stapler. I divided the remaining mesentery to free the colon specimen. This was done with the laparoscopic Enseal device. After this was complete I palpated the right colon. There was a mass palpated in the  colon that was approximately 4-5 cm in diameter. It was mobile but attached to the colon wall. It felt like a large polyp. There was some nodularity associated with this polyp along the mesenteric border of the colon. I was concerned that this was a large polyp harboring possible small foci of cancer. I do not think there was a UA that this could be resected endoscopically. I elected to proceed with a completion colectomy.  I identified the ileocolic artery and elevated this. I entered into the plane behind the artery and bluntly mobilized the retroperitoneal border of the right colon. I isolated the ileocolic artery and vein and divided these with the laparoscopic Enseal device. Hemostasis was good. I continued to laterally mobilize the right colon off of the white line of Toldt. The appendix was free as well. The terminal ileum was released from the lateral pelvic sidewall. I then divided the remaining portion of the hepatic flexure using the Enseal device. I continued to divide the gastrocolic ligament towards midline. I left a small amount of transverse colon at the middle of the mesentery intact. I then upsized my right lower quadrant port to a 12 mm port and freed off the mesentery of the  rectosigmoid junction using the Enseal device. I used a propofol. Conjunctivae and 75 mm stapler to divide the rectosigmoid junction. After this was complete the abdomen was desufflated and the left colon specimen was removed.  We then freed off the remaining mesentery of the transverse colon suture ligating the middle colic artery with a 2-0 silk suture. The right branch of the middle colic was also suture ligated with a 2-0 silk suture.  I then divided the terminal ileum using a GIA 75 mm stapler. The mesentery of the terminal ileum was sutured to control bleeding. After this was completely free the specimen was sent to pathology for further examination. We irrigated the abdomen with 1 L of warm normal saline. Hemostasis  was good. I then placed a 25 mm EEA anvil into the distal portion of the terminal ileum and brought the spike out through the antimesenteric border. The end was then stapled off with a 75 mm stapler. This left approximately 3-4 cm of pouch and the terminal ileum. After this was completed the EEA stapler was introduced into the rectum and brought out through the anterior portion of the staple line. Anastomosis was created without tension. There was no leak when insufflated under water.  I decided to leave a drain in the left upper quadrant due to the dissection created around the pancreatic border. This was brought out through the left lower quadrant port site and sutured into place with a 2-0 nylon suture. The abdomen was then desufflated. The right lower quadrant port site fascia was closed using a 0 Vicryl on a UR 6 needle. The remaining 5 mm port sites were all closed using 4-0 Vicryl sutures and Dermabond. I then placed Seprafilm underneath the midline wound and then closed the fascia using interrupted 0 Novafil sutures. The subcutaneous tissues were reapproximated using 2-0 Vicryl sutures. The skin was closed using a 4-0 Vicryl suture and a honeycomb dressing was applied. A sponge was placed around the JP drain site as well. The patient was then awakened from anesthesia and sent to the postanesthesia care unit in stable condition. All counts were correct per operating room staff.

## 2015-07-16 NOTE — Progress Notes (Signed)
Sister Janice [phone]  or  Mother Marie [phone] 

## 2015-07-16 NOTE — Anesthesia Preprocedure Evaluation (Signed)
Anesthesia Evaluation  Patient identified by MRN, date of birth, ID band Patient awake    Reviewed: Allergy & Precautions, NPO status , Patient's Chart, lab work & pertinent test results  Airway Mallampati: II  TM Distance: >3 FB Neck ROM: Full    Dental  (+) Poor Dentition, Dental Advisory Given, Missing All side teeth missing:   Pulmonary Current Smoker,    Pulmonary exam normal breath sounds clear to auscultation       Cardiovascular negative cardio ROS Normal cardiovascular exam Rhythm:Regular Rate:Normal     Neuro/Psych negative neurological ROS  negative psych ROS   GI/Hepatic negative GI ROS, (+) Hepatitis -, CDysphagia. Colon cancer   Endo/Other  negative endocrine ROS  Renal/GU negative Renal ROS  negative genitourinary   Musculoskeletal negative musculoskeletal ROS (+)   Abdominal   Peds negative pediatric ROS (+)  Hematology  (+) anemia , hgb 9.4   Anesthesia Other Findings   Reproductive/Obstetrics negative OB ROS                             Anesthesia Physical Anesthesia Plan  ASA: III  Anesthesia Plan: General   Post-op Pain Management:    Induction: Intravenous  Airway Management Planned: Oral ETT  Additional Equipment:   Intra-op Plan:   Post-operative Plan: Extubation in OR  Informed Consent:   Plan Discussed with: Surgeon  Anesthesia Plan Comments:         Anesthesia Quick Evaluation

## 2015-07-16 NOTE — H&P (View-Only) (Signed)
Edgar [name]. Gellatly 07/06/2015 9:54 AM Location: [facility] Patient #: [id number] DOB: Feb 17, 1961 Married / Language: English / Race: Black or African American Male  History of Present Illness [name] MD; [id number] 11:14 AM) Patient words: New- Colon Cancer.  The patient is a 54 year old male who presents with colorectal cancer. 54yo M who presented to the ED with abd pain, bloody stools and anemia. CT showed a splenic flexure mass. He underwent a colonoscopy in the hospital which showed a mass in the splenic flexure. Biopsy showed adenocarcinoma. CEA level was normal. CT scan also seems to show a mass in the right transverse colon. The patient was offered a colectomy this week if he stayed in the hospital but he decided to go home instead. He is here today for evaluation for surgery. He complains of abdominal cramping and constipation. He uses prune juice to have a bowel movement every 3-4 days. His symptoms have been progressively worsening since this summer. He does note blood in his stools. He was found to be anemic in the hospital and transfused several units of packed red blood cells.   Other Problems [name], CMA; 07/06/2015 11:27 AM) Hemorrhoids Colon Cancer Enlarged Prostate Hepatitis Transfusion history  Past Surgical History [name], CMA; 07/06/2015 11:27 AM) No pertinent past surgical history Colon Polyp Removal - Colonoscopy  Diagnostic Studies History [name], CMA; 07/06/2015 11:27 AM) Colonoscopy within last year  Allergies [name], CMA; 07/06/2015 9:55 AM) No Known Drug Allergies 07/06/2015  Medication History [name], CMA; 07/06/2015 9:55 AM) No Current Medications Medications Reconciled  Social History [name], CMA; [id number] AM) Illicit drug use Remotely quit drug use. No caffeine use Tobacco use Current some day smoker. Alcohol use Recently quit alcohol  use.  Family History [name], CMA; 07/06/2015 11:27 AM) Hypertension Father, Mother. Alcohol Abuse Father. Cerebrovascular Accident Father.     Review of Systems [name] CMA; 07/06/2015 11:27 AM) General Present- Fatigue. Not Present- Appetite Loss, Chills, Fever, Night Sweats, Weight Gain and Weight Loss. Skin Present- Rash. Not Present- Change in Wart/Mole, Dryness, Hives, Jaundice, New Lesions, Non-Healing Wounds and Ulcer. HEENT Present- Ringing in the Ears. Not Present- Earache, Hearing Loss, Hoarseness, Nose Bleed, Oral Ulcers, Seasonal Allergies, Sinus Pain, Sore Throat, Visual Disturbances, Wears glasses/contact lenses and Yellow Eyes. Cardiovascular Present- Shortness of Breath. Not Present- Chest Pain, Difficulty Breathing Lying Down, Leg Cramps, Palpitations, Rapid Heart Rate and Swelling of Extremities. Gastrointestinal Present- Abdominal Pain, Bloating, Bloody Stool, Change in Bowel Habits, Constipation, Excessive gas, Gets full quickly at meals, Hemorrhoids, Indigestion and Rectal Pain. Not Present- Chronic diarrhea, Difficulty Swallowing, Nausea and Vomiting. Male Genitourinary Present- Nocturia. Not Present- Blood in Urine, Change in Urinary Stream, Frequency, Impotence, Painful Urination, Urgency and Urine Leakage. Musculoskeletal Present- Joint Pain. Not Present- Back Pain, Joint Stiffness, Muscle Pain, Muscle Weakness and Swelling of Extremities. Psychiatric Present- Change in Sleep Pattern. Not Present- Anxiety, Bipolar, Depression, Fearful and Frequent crying. Endocrine Present- Cold Intolerance and Heat Intolerance. Not Present- Excessive Hunger, Hair Changes, Hot flashes and New Diabetes.  Vitals ([name] CMA; 07/06/2015 9:54 AM) 07/06/2015 9:54 AM Weight: 177.6 lb Height: 75in Body Surface Area: 2.09 m Body Mass Index: 22.2 kg/m  Temp.: 98.66F(Oral)  Pulse: 82 (Regular)  BP: 122/74 (Sitting, Left Arm,  Standard)      Physical Exam [name] MD; [id number] 11:15 AM)  General Mental Status-Alert. General Appearance-Not in acute distress. Build & Nutrition-Well nourished. Posture-Normal posture. Gait-Normal.  Head and Neck  Head-normocephalic, atraumatic with no lesions or palpable masses. Trachea-midline.  Chest and Lung Exam Chest and lung exam reveals -on auscultation, normal breath sounds, no adventitious sounds and normal vocal resonance.  Cardiovascular Cardiovascular examination reveals -normal heart sounds, regular rate and rhythm with no murmurs.  Abdomen Inspection Inspection of the abdomen reveals - No Hernias. Palpation/Percussion Palpation and Percussion of the abdomen reveal - Soft, Non Tender, No Rigidity (guarding), No hepatosplenomegaly and No Palpable abdominal masses.  Neurologic Neurologic evaluation reveals -alert and oriented x 3 with no impairment of recent or remote memory, normal attention span and ability to concentrate, normal sensation and normal coordination.  Musculoskeletal Normal Exam - Bilateral-Upper Extremity Strength Normal and Lower Extremity Strength Normal.    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 CT scan does show abutment of the tumor to the pancreas tail. CT scan also shows a possible tumor in the right transverse colon. This was discussed with radiology and confirmed. If he does have a tumor on the right side as well, I would recommend a total abdominal colectomy. We discussed the typical symptoms and side effects of an ileorectal anastomosis. We discussed that if we are not able to separate the splenic flexure tumor from the pancreatic tail, then he would also need a distal pancreatectomy and splenectomy. We discussed that we will attempt to do this using a minimally invasive  fashion, but the chances of having to convert to open are relatively high. I would like him to use MiraLAX on a daily basis until surgery. We have given him a bowel prep to complete the night before. The surgery and anatomy were described to the patient as well as the risks of surgery and the possible complications. These include: Bleeding, deep abdominal infections and possible wound complications such as hernia and infection, damage to adjacent structures, leak of surgical connections, which can lead to other surgeries and possibly an ostomy, possible need for other procedures, such as abscess drains in radiology, possible prolonged hospital stay, possible diarrhea from removal of part of the colon, possible constipation from narcotics, possible bowel, bladder or sexual dysfunction if having rectal surgery, prolonged fatigue/weakness or appetite loss, possible early recurrence of of disease, possible complications of their medical problems such as heart disease or arrhythmias or lung problems, death (less than 1%). I believe the patient understands and wishes to proceed with the surgery.

## 2015-07-17 LAB — CBC
HEMATOCRIT: 26.7 % — AB (ref 39.0–52.0)
Hemoglobin: 8.4 g/dL — ABNORMAL LOW (ref 13.0–17.0)
MCH: 21.5 pg — ABNORMAL LOW (ref 26.0–34.0)
MCHC: 31.5 g/dL (ref 30.0–36.0)
MCV: 68.5 fL — ABNORMAL LOW (ref 78.0–100.0)
PLATELETS: 587 10*3/uL — AB (ref 150–400)
RBC: 3.9 MIL/uL — ABNORMAL LOW (ref 4.22–5.81)
RDW: 28.7 % — AB (ref 11.5–15.5)
WBC: 9 10*3/uL (ref 4.0–10.5)

## 2015-07-17 LAB — BASIC METABOLIC PANEL
Anion gap: 6 (ref 5–15)
BUN: 10 mg/dL (ref 6–20)
CALCIUM: 8.2 mg/dL — AB (ref 8.9–10.3)
CO2: 26 mmol/L (ref 22–32)
CREATININE: 1.1 mg/dL (ref 0.61–1.24)
Chloride: 100 mmol/L — ABNORMAL LOW (ref 101–111)
GFR calc Af Amer: >60 mL/min (ref >60)
GLUCOSE: 135 mg/dL — AB (ref 65–99)
Potassium: 4.2 mmol/L (ref 3.5–5.1)
SODIUM: 132 mmol/L — AB (ref 135–145)

## 2015-07-17 MED ORDER — MORPHINE SULFATE (PF) 2 MG/ML IV SOLN
2.0000 mg | INTRAVENOUS | Status: AC
  Administered 2015-07-17: 2 mg via INTRAVENOUS

## 2015-07-17 MED ORDER — MORPHINE SULFATE (PF) 2 MG/ML IV SOLN
INTRAVENOUS | Status: AC
  Administered 2015-07-17: 2 mg via INTRAVENOUS
  Filled 2015-07-17: qty 1

## 2015-07-17 MED ORDER — MORPHINE SULFATE (PF) 4 MG/ML IV SOLN
4.0000 mg | INTRAVENOUS | Status: DC | PRN
  Administered 2015-07-17 – 2015-07-19 (×10): 6 mg via INTRAVENOUS
  Administered 2015-07-19: 4 mg via INTRAVENOUS
  Administered 2015-07-19: 6 mg via INTRAVENOUS
  Administered 2015-07-19 – 2015-07-21 (×9): 4 mg via INTRAVENOUS
  Administered 2015-07-22 (×2): 6 mg via INTRAVENOUS
  Filled 2015-07-17: qty 2
  Filled 2015-07-17: qty 1
  Filled 2015-07-17: qty 2
  Filled 2015-07-17: qty 1
  Filled 2015-07-17: qty 2
  Filled 2015-07-17 (×5): qty 1
  Filled 2015-07-17: qty 2
  Filled 2015-07-17: qty 1
  Filled 2015-07-17: qty 2
  Filled 2015-07-17: qty 1
  Filled 2015-07-17 (×3): qty 2
  Filled 2015-07-17: qty 1
  Filled 2015-07-17 (×2): qty 2
  Filled 2015-07-17: qty 1
  Filled 2015-07-17: qty 2
  Filled 2015-07-17: qty 1
  Filled 2015-07-17: qty 2

## 2015-07-17 NOTE — Progress Notes (Signed)
Colon cancer ([place])  Subjective: Pt feels sore, no nausea, Cr up a bit but UOP ok  Objective: Vital signs in last 24 hours: Temp:  [98 F (36.7 C)-98.8 F (37.1 C)] 98.5 F (36.9 C) (12/10 0514) Pulse Rate:  [71-93] 83 (12/10 0514) Resp:  [8-26] 16 (12/10 0514) BP: (122-169)/(69-99) 143/74 mmHg (12/10 0514) SpO2:  [75 %-100 %] 100 % (12/10 0514)    Intake/Output from previous day: 12/09 0701 - 12/10 0700 In: 3900 [I.V.:3900] Out: 2375 [Urine:1570; Drains:605; Blood:200] Intake/Output this shift:    General appearance: alert and cooperative GI: soft, nondistended Incision/Wound: clean, dry  Lab Results:  Results for orders placed or performed during the hospital encounter of 07/16/15 (from the past 24 hour(s))  Basic metabolic panel     Status: Abnormal   Collection Time: 07/17/15  5:35 AM  Result Value Ref Range   Sodium 132 (L) 135 - 145 mmol/L   Potassium 4.2 3.5 - 5.1 mmol/L   Chloride 100 (L) 101 - 111 mmol/L   CO2 26 22 - 32 mmol/L   Glucose, Bld 135 (H) 65 - 99 mg/dL   BUN 10 6 - 20 mg/dL   Creatinine, Ser 1.10 0.61 - 1.24 mg/dL   Calcium 8.2 (L) 8.9 - 10.3 mg/dL   GFR calc non Af Amer >60 >60 mL/min   GFR calc Af Amer >60 >60 mL/min   Anion gap 6 5 - 15  CBC     Status: Abnormal   Collection Time: 07/17/15  5:35 AM  Result Value Ref Range   WBC 9.0 4.0 - 10.5 K/uL   RBC 3.90 (L) 4.22 - 5.81 MIL/uL   Hemoglobin 8.4 (L) 13.0 - 17.0 g/dL   HCT 26.7 (L) 39.0 - 52.0 %   MCV 68.5 (L) 78.0 - 100.0 fL   MCH 21.5 (L) 26.0 - 34.0 pg   MCHC 31.5 30.0 - 36.0 g/dL   RDW 28.7 (H) 11.5 - 15.5 %   Platelets 587 (H) 150 - 400 K/uL     Studies/Results Radiology     MEDS, Scheduled . acetaminophen  1,000 mg Oral 4 times per day  . alvimopan  12 mg Oral BID  . enoxaparin (LOVENOX) injection  40 mg Subcutaneous Q24H     Assessment: Colon cancer ([place]) S/p TAC, doing as expected  Plan: Advance to clears Decrease MIV, recheck labs in AM Foley out  tomorrow Ambulate  LOS: 1 day    Rosario Adie, MD [facility], [place]   07/17/2015 8:52 AM

## 2015-07-18 LAB — BASIC METABOLIC PANEL
ANION GAP: 6 (ref 5–15)
BUN: 8 mg/dL (ref 6–20)
CO2: 30 mmol/L (ref 22–32)
Calcium: 8.8 mg/dL — ABNORMAL LOW (ref 8.9–10.3)
Chloride: 101 mmol/L (ref 101–111)
Creatinine, Ser: 0.96 mg/dL (ref 0.61–1.24)
GFR calc Af Amer: >60 mL/min (ref >60)
GLUCOSE: 122 mg/dL — AB (ref 65–99)
POTASSIUM: 4.1 mmol/L (ref 3.5–5.1)
Sodium: 137 mmol/L (ref 135–145)

## 2015-07-18 LAB — CBC
HEMATOCRIT: 29.2 % — AB (ref 39.0–52.0)
HEMOGLOBIN: 9 g/dL — AB (ref 13.0–17.0)
MCH: 21.4 pg — ABNORMAL LOW (ref 26.0–34.0)
MCHC: 30.8 g/dL (ref 30.0–36.0)
MCV: 69.4 fL — ABNORMAL LOW (ref 78.0–100.0)
Platelets: 596 10*3/uL — ABNORMAL HIGH (ref 150–400)
RBC: 4.21 MIL/uL — ABNORMAL LOW (ref 4.22–5.81)
RDW: 28.4 % — ABNORMAL HIGH (ref 11.5–15.5)
WBC: 10.8 10*3/uL — ABNORMAL HIGH (ref 4.0–10.5)

## 2015-07-18 NOTE — Progress Notes (Signed)
Colon cancer ([place])  Subjective: Pt with pain yesterday, Morphine increased, feels better this am.  no nausea, tolerating some clears  Objective: Vital signs in last 24 hours: Temp:  [98.3 F (36.8 C)-99 F (37.2 C)] 99 F (37.2 C) (12/11 0554) Pulse Rate:  [78-89] 89 (12/11 0554) Resp:  [18] 18 (12/11 0554) BP: (134-151)/(62-83) 134/77 mmHg (12/11 0554) SpO2:  [100 %] 100 % (12/11 0554) Last BM Date: 07/16/15  Intake/Output from previous day: 12/10 0701 - 12/11 0700 In: 1800 [I.V.:1800] Out: 3785 [Urine:3400; Drains:385] Intake/Output this shift: Total I/O In: 225 [I.V.:225] Out: 125 [Drains:125]  General appearance: alert and cooperative GI: soft, nondistended Incision/Wound: clean, dry  Lab Results:  Results for orders placed or performed during the hospital encounter of 07/16/15 (from the past 24 hour(s))  Basic metabolic panel     Status: Abnormal   Collection Time: 07/18/15  5:43 AM  Result Value Ref Range   Sodium 137 135 - 145 mmol/L   Potassium 4.1 3.5 - 5.1 mmol/L   Chloride 101 101 - 111 mmol/L   CO2 30 22 - 32 mmol/L   Glucose, Bld 122 (H) 65 - 99 mg/dL   BUN 8 6 - 20 mg/dL   Creatinine, Ser 0.96 0.61 - 1.24 mg/dL   Calcium 8.8 (L) 8.9 - 10.3 mg/dL   GFR calc non Af Amer >60 >60 mL/min   GFR calc Af Amer >60 >60 mL/min   Anion gap 6 5 - 15  CBC     Status: Abnormal   Collection Time: 07/18/15  5:43 AM  Result Value Ref Range   WBC 10.8 (H) 4.0 - 10.5 K/uL   RBC 4.21 (L) 4.22 - 5.81 MIL/uL   Hemoglobin 9.0 (L) 13.0 - 17.0 g/dL   HCT 29.2 (L) 39.0 - 52.0 %   MCV 69.4 (L) 78.0 - 100.0 fL   MCH 21.4 (L) 26.0 - 34.0 pg   MCHC 30.8 30.0 - 36.0 g/dL   RDW 28.4 (H) 11.5 - 15.5 %   Platelets 596 (H) 150 - 400 K/uL     Studies/Results Radiology     MEDS, Scheduled . alvimopan  12 mg Oral BID  . enoxaparin (LOVENOX) injection  40 mg Subcutaneous Q24H     Assessment: Colon cancer ([place]) S/p TAC, doing as expected  Plan: Cont clears until  tolerating this better Cont MIV, UOP good, Cr down recheck labs in AM Ambulate  LOS: 2 days    Rosario Adie, MD [facility], [place]   07/18/2015 9:30 AM

## 2015-07-19 LAB — MISC LABCORP TEST (SEND OUT)

## 2015-07-19 LAB — BASIC METABOLIC PANEL
Anion gap: 8 (ref 5–15)
BUN: 15 mg/dL (ref 6–20)
CALCIUM: 9 mg/dL (ref 8.9–10.3)
CO2: 26 mmol/L (ref 22–32)
CREATININE: 1.3 mg/dL — AB (ref 0.61–1.24)
Chloride: 102 mmol/L (ref 101–111)
GFR calc Af Amer: >60 mL/min (ref >60)
Glucose, Bld: 141 mg/dL — ABNORMAL HIGH (ref 65–99)
Potassium: 4.3 mmol/L (ref 3.5–5.1)
Sodium: 136 mmol/L (ref 135–145)

## 2015-07-19 LAB — CBC
HCT: 32.6 % — ABNORMAL LOW (ref 39.0–52.0)
Hemoglobin: 10 g/dL — ABNORMAL LOW (ref 13.0–17.0)
MCH: 21.1 pg — AB (ref 26.0–34.0)
MCHC: 30.7 g/dL (ref 30.0–36.0)
MCV: 68.6 fL — ABNORMAL LOW (ref 78.0–100.0)
PLATELETS: 643 10*3/uL — AB (ref 150–400)
RBC: 4.75 MIL/uL (ref 4.22–5.81)
RDW: 28.6 % — ABNORMAL HIGH (ref 11.5–15.5)
WBC: 13.9 10*3/uL — ABNORMAL HIGH (ref 4.0–10.5)

## 2015-07-19 MED ORDER — SODIUM CHLORIDE 0.9 % IV BOLUS (SEPSIS)
1000.0000 mL | Freq: Once | INTRAVENOUS | Status: AC
  Administered 2015-07-19: 1000 mL via INTRAVENOUS

## 2015-07-19 MED ORDER — LABETALOL HCL 100 MG PO TABS: 100.0000 mg | ORAL_TABLET | Freq: Once | ORAL | Status: DC

## 2015-07-19 MED ORDER — SODIUM CHLORIDE 0.9 % IV BOLUS (SEPSIS)
500.0000 mL | Freq: Once | INTRAVENOUS | Status: AC
  Administered 2015-07-19: 500 mL via INTRAVENOUS

## 2015-07-19 NOTE — Progress Notes (Signed)
07/19/15  Spoke with Dr Marcello Moores regarding patients concern that he was not receiving antibiotics. MD aware that patients WBC has increased a little since yesterday and of the patients concern about not currently receiving antibiotics. Per MD she is not considering antibiotics at this time and she know about the WBCs.

## 2015-07-19 NOTE — Progress Notes (Addendum)
Colon cancer ([place])  Subjective: Pt with nausea and vomiting yesterday, low UOP.  Foley replaced.    Objective: Vital signs in last 24 hours: Temp:  [98.1 F (36.7 C)-98.8 F (37.1 C)] 98.7 F (37.1 C) (12/12 0639) Pulse Rate:  [88-104] 88 (12/12 0639) Resp:  [18] 18 (12/12 0639) BP: (125-146)/(80-91) 125/80 mmHg (12/12 0639) SpO2:  [95 %-100 %] 99 % (12/12 0639) Last BM Date: 07/18/15  Intake/Output from previous day: 12/11 0701 - 12/12 0700 In: 1500 [I.V.:1500] Out: 905 [Urine:300; Drains:605] Intake/Output this shift:    General appearance: alert and cooperative GI: soft, nondistended Incision/Wound: clean, dry  Lab Results:  Results for orders placed or performed during the hospital encounter of 07/16/15 (from the past 24 hour(s))  Basic metabolic panel     Status: Abnormal   Collection Time: 07/19/15  4:50 AM  Result Value Ref Range   Sodium 136 135 - 145 mmol/L   Potassium 4.3 3.5 - 5.1 mmol/L   Chloride 102 101 - 111 mmol/L   CO2 26 22 - 32 mmol/L   Glucose, Bld 141 (H) 65 - 99 mg/dL   BUN 15 6 - 20 mg/dL   Creatinine, Ser 1.30 (H) 0.61 - 1.24 mg/dL   Calcium 9.0 8.9 - 10.3 mg/dL   GFR calc non Af Amer >60 >60 mL/min   GFR calc Af Amer >60 >60 mL/min   Anion gap 8 5 - 15  CBC     Status: Abnormal   Collection Time: 07/19/15  4:50 AM  Result Value Ref Range   WBC 13.9 (H) 4.0 - 10.5 K/uL   RBC 4.75 4.22 - 5.81 MIL/uL   Hemoglobin 10.0 (L) 13.0 - 17.0 g/dL   HCT 32.6 (L) 39.0 - 52.0 %   MCV 68.6 (L) 78.0 - 100.0 fL   MCH 21.1 (L) 26.0 - 34.0 pg   MCHC 30.7 30.0 - 36.0 g/dL   RDW 28.6 (H) 11.5 - 15.5 %   Platelets 643 (H) 150 - 400 K/uL     Studies/Results Radiology     MEDS, Scheduled . enoxaparin (LOVENOX) injection  40 mg Subcutaneous Q24H  . sodium chloride  1,000 mL Intravenous Once     Assessment: Colon cancer ([place]) S/p TAC, appears to have developed an ileus  Plan: NPO MIV to 143ml/h, NS bolus 1 L recheck labs in AM Will check  JP lipase as well Ambulate  LOS: 3 days    Rosario Adie, MD [facility], [place]   07/19/2015 8:31 AM

## 2015-07-19 NOTE — Progress Notes (Signed)
Pt stated that he had been unable to void since foley removed yesterday morning. Pt able to void 200ccs at 0130. Bladder scanner showed 155ccs.  Encouraged pt to drink fluids as he stated he had been avoiding fluids to avoid getting up and down to urinate often. Pt drank 500ccs over a 4 hour period. Pt unable to void at 0530. Bladder scanner showed 253ccs. Paged on call MD. Orders to place foley obtained. Pt tolerated procedure well, 100ccs of amber urine returned.  Roselind Rily

## 2015-07-20 LAB — BASIC METABOLIC PANEL
ANION GAP: 7 (ref 5–15)
BUN: 14 mg/dL (ref 6–20)
CALCIUM: 8.2 mg/dL — AB (ref 8.9–10.3)
CO2: 23 mmol/L (ref 22–32)
Chloride: 102 mmol/L (ref 101–111)
Creatinine, Ser: 0.99 mg/dL (ref 0.61–1.24)
Glucose, Bld: 123 mg/dL — ABNORMAL HIGH (ref 65–99)
Potassium: 4.1 mmol/L (ref 3.5–5.1)
SODIUM: 132 mmol/L — AB (ref 135–145)

## 2015-07-20 LAB — CBC
HEMATOCRIT: 28.5 % — AB (ref 39.0–52.0)
Hemoglobin: 8.9 g/dL — ABNORMAL LOW (ref 13.0–17.0)
MCH: 21.5 pg — ABNORMAL LOW (ref 26.0–34.0)
MCHC: 31.2 g/dL (ref 30.0–36.0)
MCV: 68.8 fL — ABNORMAL LOW (ref 78.0–100.0)
PLATELETS: 544 10*3/uL — AB (ref 150–400)
RBC: 4.14 MIL/uL — ABNORMAL LOW (ref 4.22–5.81)
RDW: 29.3 % — AB (ref 11.5–15.5)
WBC: 11.3 10*3/uL — AB (ref 4.0–10.5)

## 2015-07-20 MED ORDER — PANTOPRAZOLE SODIUM 40 MG PO TBEC
40.0000 mg | DELAYED_RELEASE_TABLET | Freq: Every day | ORAL | Status: DC
  Administered 2015-07-20 – 2015-07-23 (×4): 40 mg via ORAL
  Filled 2015-07-20 (×4): qty 1

## 2015-07-20 MED ORDER — SODIUM CHLORIDE 0.9 % IV BOLUS (SEPSIS)
500.0000 mL | Freq: Once | INTRAVENOUS | Status: AC
  Administered 2015-07-20: 500 mL via INTRAVENOUS

## 2015-07-20 NOTE — Progress Notes (Addendum)
Pt c/o pressure in abd and stating that he felt like the catheter wasn't in the right place.  Bladder scanned for 291cc (unsure if urine or peritoneal fluid).  Deflated foley balloon, cleaned tubing and reinserted foley several more inches to check for patentcy.  Emptied 160cc cloudy, amber urine from foley bag.  JP drain frequently emptied during shift.  B/P @ 2200 was 156/100.  Currently 153/92

## 2015-07-20 NOTE — Progress Notes (Signed)
Colon cancer ([place])  Subjective: Pt with low UOP yesterday.  Foley replaced.  Received 1 L bolus overnight.  Having some pickup in uop.  Less coming from JP this am than yesterday.  States he is having good bowel function this am  Objective: Vital signs in last 24 hours: Temp:  [98 F (36.7 C)-99.1 F (37.3 C)] 98.8 F (37.1 C) (12/13 0600) Pulse Rate:  [87-92] 90 (12/13 0600) Resp:  [16-18] 16 (12/13 0600) BP: (136-156)/(89-100) 136/89 mmHg (12/13 0600) SpO2:  [98 %-99 %] 98 % (12/13 0600) Last BM Date: 07/19/15  Intake/Output from previous day: 12/12 0701 - 12/13 0700 In: 4417.1 [I.V.:2417.1; IV Piggyback:2000] Out: 1255 [Urine:410; Drains:845] Intake/Output this shift: Total I/O In: -  Out: 250 [Urine:150; Drains:100]  General appearance: alert and cooperative GI: soft, less distended Incision/Wound: clean, dry JP: serous drainage  Lab Results:  Results for orders placed or performed during the hospital encounter of 07/16/15 (from the past 24 hour(s))  CBC     Status: Abnormal   Collection Time: 07/20/15  5:55 AM  Result Value Ref Range   WBC 11.3 (H) 4.0 - 10.5 K/uL   RBC 4.14 (L) 4.22 - 5.81 MIL/uL   Hemoglobin 8.9 (L) 13.0 - 17.0 g/dL   HCT 28.5 (L) 39.0 - 52.0 %   MCV 68.8 (L) 78.0 - 100.0 fL   MCH 21.5 (L) 26.0 - 34.0 pg   MCHC 31.2 30.0 - 36.0 g/dL   RDW 29.3 (H) 11.5 - 15.5 %   Platelets 544 (H) 150 - 400 K/uL  Basic metabolic panel     Status: Abnormal   Collection Time: 07/20/15  5:55 AM  Result Value Ref Range   Sodium 132 (L) 135 - 145 mmol/L   Potassium 4.1 3.5 - 5.1 mmol/L   Chloride 102 101 - 111 mmol/L   CO2 23 22 - 32 mmol/L   Glucose, Bld 123 (H) 65 - 99 mg/dL   BUN 14 6 - 20 mg/dL   Creatinine, Ser 0.99 0.61 - 1.24 mg/dL   Calcium 8.2 (L) 8.9 - 10.3 mg/dL   GFR calc non Af Amer >60 >60 mL/min   GFR calc Af Amer >60 >60 mL/min   Anion gap 7 5 - 15     Studies/Results Radiology     MEDS, Scheduled . enoxaparin (LOVENOX) injection   40 mg Subcutaneous Q24H     Assessment: Colon cancer (HCC) S/p TAC, appears to have developed an ileus  Plan: NPO MIV 164ml/h- cont Cr and wbc down today recheck labs in AM Start clears again, now that bowels are functioning better. Will check JP lipase as well Ambulate  LOS: 4 days    Rosario Adie, MD [facility] Surgery, [place]   07/20/2015 12:47 PM

## 2015-07-21 LAB — LIPASE, FLUID
Lipase-Fluid: 23 U/L
Lipase-Fluid: 23 U/L

## 2015-07-21 LAB — BASIC METABOLIC PANEL
ANION GAP: 6 (ref 5–15)
BUN: 9 mg/dL (ref 6–20)
CALCIUM: 8.4 mg/dL — AB (ref 8.9–10.3)
CO2: 23 mmol/L (ref 22–32)
Chloride: 106 mmol/L (ref 101–111)
Creatinine, Ser: 0.99 mg/dL (ref 0.61–1.24)
Glucose, Bld: 131 mg/dL — ABNORMAL HIGH (ref 65–99)
POTASSIUM: 4.2 mmol/L (ref 3.5–5.1)
Sodium: 135 mmol/L (ref 135–145)

## 2015-07-21 LAB — CBC
HEMATOCRIT: 24.7 % — AB (ref 39.0–52.0)
Hemoglobin: 7.9 g/dL — ABNORMAL LOW (ref 13.0–17.0)
MCH: 21.7 pg — ABNORMAL LOW (ref 26.0–34.0)
MCHC: 32 g/dL (ref 30.0–36.0)
MCV: 67.9 fL — ABNORMAL LOW (ref 78.0–100.0)
PLATELETS: 518 10*3/uL — AB (ref 150–400)
RBC: 3.64 MIL/uL — AB (ref 4.22–5.81)
RDW: 28.6 % — AB (ref 11.5–15.5)
WBC: 9.1 10*3/uL (ref 4.0–10.5)

## 2015-07-21 MED ORDER — OXYCODONE HCL 5 MG PO TABS
5.0000 mg | ORAL_TABLET | ORAL | Status: DC | PRN
  Administered 2015-07-21 – 2015-07-23 (×6): 10 mg via ORAL
  Filled 2015-07-21 (×6): qty 2

## 2015-07-21 MED ORDER — BOOST PLUS PO LIQD
237.0000 mL | Freq: Three times a day (TID) | ORAL | Status: DC
  Administered 2015-07-21 – 2015-07-23 (×4): 237 mL via ORAL
  Filled 2015-07-21 (×6): qty 237

## 2015-07-21 MED ORDER — ADULT MULTIVITAMIN LIQUID CH
5.0000 mL | Freq: Every day | ORAL | Status: DC
  Administered 2015-07-21 – 2015-07-23 (×3): 5 mL via ORAL
  Filled 2015-07-21 (×3): qty 5

## 2015-07-21 NOTE — Progress Notes (Signed)
Colon cancer ([place])  Subjective: UOP better.  Pt feels better.  Having 3-4 BM's per day.   Objective: Vital signs in last 24 hours: Temp:  [98.1 F (36.7 C)-99 F (37.2 C)] 98.8 F (37.1 C) (12/14 0513) Pulse Rate:  [77-91] 77 (12/14 0513) Resp:  [16-18] 16 (12/14 0513) BP: (146-151)/(78-84) 151/78 mmHg (12/14 0513) SpO2:  [99 %-100 %] 99 % (12/14 0513) Last BM Date: 07/19/15  Intake/Output from previous day: 12/13 0701 - 12/14 0700 In: -  Out: 1895 [Urine:1525; Drains:370] Intake/Output this shift:    General appearance: alert and cooperative GI: soft, less distended Incision/Wound: clean, dry JP: serous drainage, lipase 23  Lab Results:  Results for orders placed or performed during the hospital encounter of 07/16/15 (from the past 24 hour(s))  CBC     Status: Abnormal   Collection Time: 07/21/15  4:38 AM  Result Value Ref Range   WBC 9.1 4.0 - 10.5 K/uL   RBC 3.64 (L) 4.22 - 5.81 MIL/uL   Hemoglobin 7.9 (L) 13.0 - 17.0 g/dL   HCT 24.7 (L) 39.0 - 52.0 %   MCV 67.9 (L) 78.0 - 100.0 fL   MCH 21.7 (L) 26.0 - 34.0 pg   MCHC 32.0 30.0 - 36.0 g/dL   RDW 28.6 (H) 11.5 - 15.5 %   Platelets 518 (H) 150 - 400 K/uL  Basic metabolic panel     Status: Abnormal   Collection Time: 07/21/15  4:38 AM  Result Value Ref Range   Sodium 135 135 - 145 mmol/L   Potassium 4.2 3.5 - 5.1 mmol/L   Chloride 106 101 - 111 mmol/L   CO2 23 22 - 32 mmol/L   Glucose, Bld 131 (H) 65 - 99 mg/dL   BUN 9 6 - 20 mg/dL   Creatinine, Ser 0.99 0.61 - 1.24 mg/dL   Calcium 8.4 (L) 8.9 - 10.3 mg/dL   GFR calc non Af Amer >60 >60 mL/min   GFR calc Af Amer >60 >60 mL/min   Anion gap 6 5 - 15     Studies/Results Radiology     MEDS, Scheduled . enoxaparin (LOVENOX) injection  40 mg Subcutaneous Q24H  . pantoprazole  40 mg Oral Daily     Assessment: Colon cancer ([place]) S/p TAC, ileus resolved  Plan: NPO MIV to 37ml/h- cont Cr and wbc down today Start soft diet PO pain meds D/C JP,  lipase normal Ambulate   LOS: 5 days    Rosario Adie, MD [facility], [place] [phone]   07/21/2015 8:40 AM

## 2015-07-21 NOTE — Progress Notes (Signed)
Patient is alert and oriented. VS stable. Foley catheter and JP removed today. Started oxycodone 10mg  PRN, given once, but patient stated that it did not work for him. Family at bedside. Up in room and encouraged to walk in the hall multiple times. No complaints of nausea/vomitting. Patient started on Boost today. No s/s of acute distress. Will continue to monitor.

## 2015-07-21 NOTE — Plan of Care (Signed)
Problem: Food- and Nutrition-Related Knowledge Deficit (NB-1.1) Goal: Nutrition education Formal process to instruct or train a patient/client in a skill or to impart knowledge to help patients/clients voluntarily manage or modify food choices and eating behavior to maintain or improve health. Outcome: Completed/Met Date Met:  07/21/15 Nutrition Education Note  RD consulted for nutrition education regarding diet for s/p colectomy. Pt was educated on Low-Fiber diet.   RD provided "Low Fiber Nutrition Therapy" handout from the Academy of [employer]etics. Reviewed low and high fiber foods. Provided examples on ways to decrease fiber intake in diet. Discouraged intake of processed foods and gas-producing foods. Encouraged use of a chewable multi-vitamin and nutritional protein supplement.Teach back method used.  Expect good compliance. Pt with support from brother. Per brother, he has already bought pt some Boost at home. RD to order Boost for patient.  Body mass index is 22.15 kg/(m^2). Pt meets criteria for normal range based on current BMI.  Current diet order is soft. Labs and medications reviewed. No further nutrition interventions warranted at this time. If additional nutrition issues arise, please re-consult RD.  Edgar Bibles, MS, RD, LDN Pager: [phone] After Hours Pager: [phone]

## 2015-07-22 MED ORDER — MORPHINE SULFATE (PF) 4 MG/ML IV SOLN: 4.0000 mg | INTRAVENOUS | Status: DC | PRN

## 2015-07-22 NOTE — Progress Notes (Signed)
Colon cancer (HCC)  Subjective: UOP good.  Pt feels better.  Having 3-4 BM's per day.  Pain controlled.  Tolerating a soft diet Objective: Vital signs in last 24 hours: Temp:  [98.1 F (36.7 C)-99.2 F (37.3 C)] 98.1 F (36.7 C) (12/15 0500) Pulse Rate:  [77-80] 77 (12/15 0500) Resp:  [16-18] 18 (12/15 0500) BP: (138-158)/(72-89) 158/72 mmHg (12/15 0500) SpO2:  [99 %-100 %] 99 % (12/15 0500) Last BM Date: 07/19/15  Intake/Output from previous day: 12/14 0701 - 12/15 0700 In: 2250 [P.O.:600; I.V.:1650] Out: 1870 [Urine:1750; Drains:120] Intake/Output this shift:    General appearance: alert and cooperative GI: soft, less distended Incision/Wound: clean, dry, healing well   Lab Results:  No results found for this or any previous visit (from the past 24 hour(s)).   Studies/Results Radiology     MEDS, Scheduled . enoxaparin (LOVENOX) injection  40 mg Subcutaneous Q24H  . lactose free nutrition  237 mL Oral TID WC  . multivitamin  5 mL Oral Daily  . pantoprazole  40 mg Oral Daily     Assessment: Colon cancer ([place]) S/p TAC, ileus resolved  Plan: NPO SL IV, will need to make sure pt can keep up with his stool output with oral intake Cont soft diet, encourage liquid intake Cont PO pain meds Ambulate Hopefully d/c in AM   LOS: 6 days    Rosario Adie, MD [facility] Surgery, [place]   07/22/2015 8:04 AM

## 2015-07-22 NOTE — Progress Notes (Signed)
PHARMACIST - PHYSICIAN COMMUNICATION  Key Points: Use following P&T approved IV to PO diphenhydramine (Benadryl) policy. Description contains the criteria that are approved  DR:   Thomas CONCERNING: IV to Oral Route Change Policy  RECOMMENDATION: This patient is receiving diphenhydramine by the intravenous route.  Based on criteria approved by the Pharmacy and Therapeutics Committee, intravenous diphenhydramine is being converted to the equivalent oral dose form(s).   DESCRIPTION: These criteria include:  Diphenhydramine is not prescribed to treat or prevent a severe allergic reaction  Diphenhydramine is not prescribed as premedication prior to receiving blood product, biologic medication, antimicrobial, or chemotherapy agent  The patient has tolerated at least one dose of an oral or enteral medication  The patient has no evidence of active gastrointestinal bleeding or impaired GI absorption (gastrectomy, short bowel, patient on TNA or NPO).  The patient is not undergoing procedural sedation   If you have questions about this conversion, please contact the Pharmacy Department  []   [phone] )  [facility] PharmD, [place] Pager [phone] 07/22/2015 3:00 PM

## 2015-07-22 NOTE — Discharge Instructions (Signed)
ABDOMINAL SURGERY: POST OP INSTRUCTIONS  1. DIET: Follow a light bland diet the first 24 hours after arrival home, such as soup, liquids, crackers, etc.  Be sure to include lots of fluids daily.  Avoid fast food or heavy meals as your are more likely to get nauseated.  Do not eat any uncooked fruits or vegetables for the next 2 weeks as your colon heals. 2. Take your usually prescribed home medications unless otherwise directed. 3. PAIN CONTROL: a. Pain is best controlled by a usual combination of three different methods TOGETHER: i. Ice/Heat ii. Over the counter pain medication iii. Prescription pain medication b. Most patients will experience some swelling and bruising around the incisions.  Ice packs or heating pads (30-60 minutes up to 6 times a day) will help. Use ice for the first few days to help decrease swelling and bruising, then switch to heat to help relax tight/sore spots and speed recovery.  Some people prefer to use ice alone, heat alone, alternating between ice & heat.  Experiment to what works for you.  Swelling and bruising can take several weeks to resolve.   c. It is helpful to take an over-the-counter pain medication regularly for the first few weeks.  Choose one of the following that works best for you: i. Naproxen (Aleve, etc)  Two 220mg  tabs twice a day ii. Ibuprofen (Advil, etc) Three 200mg  tabs four times a day (every meal & bedtime) iii. Acetaminophen (Tylenol, etc) 500-650mg  four times a day (every meal & bedtime) d. A  prescription for pain medication (such as oxycodone, hydrocodone, etc) should be given to you upon discharge.  Take your pain medication as prescribed.  i. If you are having problems/concerns with the prescription medicine (does not control pain, nausea, vomiting, rash, itching, etc), please call us [phone] to see if we need to switch you to a different pain medicine that will work better for you and/or control your side effect better. ii. If you  need a refill on your pain medication, please contact your pharmacy.  They will contact our office to request authorization. Prescriptions will not be filled after 5 pm or on week-ends.  4. Watch out for diarrhea.  If you have many loose bowel movements, simplify your diet to bland foods & liquids for a few days. Switching to mild anti-diarrheal medications (Kayopectate, Pepto Bismol) can help.  If you have more than 3-4 BM's per day or if your urine becomes dark, drink 1 liter of Gatorade or another electrolyte rich fluid and take an imodium.  Continue to do this every 4-6 hours until you see improvement. If this worsens or does not improve, please call us. 5. Wash / shower every day.  You may shower over the incision / wound.  Avoid baths until the skin is fully healed.  Continue to shower over incision(s) after the dressing is off. 6. You may leave the incision open to air.  You may replace a dressing/Band-Aid to cover the incision for comfort if you wish. 7. ACTIVITIES as tolerated:   a. You may resume regular (light) daily activities beginning the next day--such as daily self-care, walking, climbing stairs--gradually increasing activities as tolerated.  If you can walk 30 minutes without difficulty, it is safe to try more intense activity such as jogging, treadmill, bicycling, low-impact aerobics, swimming, etc. b. Save the most intensive and strenuous activity for last such as sit-ups, heavy lifting, contact sports, etc  Refrain from any heavy lifting or straining until you  are off narcotics for pain control.   c. DO NOT PUSH THROUGH PAIN.  Let pain be your guide: If it hurts to do something, don't do it.  Pain is your body warning you to avoid that activity for another week until the pain goes down. d. You may drive when you are no longer taking prescription pain medication, you can comfortably wear a seatbelt, and you can safely maneuver your car and apply brakes. e. Dennis Bast may have sexual intercourse  when it is comfortable.  8. FOLLOW UP in our office a. Please call [facility] at [phone] to set up an appointment to see your surgeon in the office for a follow-up appointment approximately 1-2 weeks after your surgery. b. Make sure that you call for this appointment the day you arrive home to insure a convenient appointment time. 10. IF YOU HAVE DISABILITY OR FAMILY LEAVE FORMS, BRING THEM TO THE OFFICE FOR PROCESSING.  DO NOT GIVE THEM TO YOUR DOCTOR.   WHEN TO CALL us [phone]: 1. Poor pain control 2. Reactions / problems with new medications (rash/itching, nausea, etc)  3. Fever over 101.5 F (38.5 C) 4. Inability to urinate 5. Nausea and/or vomiting 6. Worsening swelling or bruising 7. Continued bleeding from incision. 8. Increased pain, redness, or drainage from the incision  The clinic staff is available to answer your questions during regular business hours (8:30am-5pm).  Please dont hesitate to call and ask to speak to one of our nurses for clinical concerns.   A surgeon from [facility] is always on call at the hospitals   If you have a medical emergency, go to the nearest emergency room or call 911.    [facility], [place], [place], [place], [place]  [address] ? MAIN: [phone] ? TOLL FREE: [phone] ? FAX [phone] [email or web address]

## 2015-07-23 MED ORDER — OXYCODONE HCL 5 MG PO TABS: 5.0000 mg | ORAL_TABLET | ORAL | Status: DC | PRN

## 2015-07-23 NOTE — Discharge Summary (Signed)
Physician Discharge Summary  Patient ID: GRANITE DOWSETT MRN: [id number] DOB/AGE: Jul 19, 1961 54 y.o.  Admit date: 07/16/2015 Discharge date: 07/23/2015  Admission Diagnoses: colon cancer  Discharge Diagnoses:  Principal Problem:   Colon cancer [facility])   Discharged Condition: good  Hospital Course: Patient admitted after total abdominal colectomy for an obstructing splenic flexure cancer and a right sided colon mass.  His diet was advanced as tolerated.  He developed a bit of an ileus for a couple day with associated renal inssufficiency, but this resolved with fluids and time.  His diet was advanced again.  Once he was able to tolerate a diet and keep his UOP up on his own he was discharged to home.    Consults: None  Significant Diagnostic Studies: labs: cbc, chemistry  Treatments: IV hydration, analgesia: acetaminophen w/ codeine and surgery: total abdominal colectomy  Discharge Exam: Blood pressure 129/66, pulse 76, temperature 98.1 F (36.7 C), temperature source Oral, resp. rate 16, height 6\' 3"  (1.905 m), weight 80.4 kg (177 lb 4 oz), SpO2 100 %. General appearance: alert and cooperative GI: normal findings: soft, non-tender Incision/Wound: clean, dry, intact  Disposition: 01-Home or Self Care     Medication List    STOP taking these medications        traMADol 50 MG tablet  Commonly known as:  ULTRAM      TAKE these medications        ferrous sulfate 325 (65 FE) MG EC tablet  Take 1 tablet (325 mg total) by mouth 2 (two) times daily.     oxyCODONE 5 MG immediate release tablet  Commonly known as:  Oxy IR/ROXICODONE  Take 1-2 tablets (5-10 mg total) by mouth every 4 (four) hours as needed for moderate pain, severe pain or breakthrough pain.           Follow-up Information    Follow up with Rosario Adie., MD. Schedule an appointment as soon as possible for a visit in 2 weeks.   Specialty:  General Surgery   Contact information:   [place] [address] [phone]       Signed: Rosario Adie 0000000, 9:53 AM

## 2015-07-23 NOTE — Progress Notes (Signed)
Patient alert and oriented, pain controlled. Patient given discharge instructions/prescriptions and patient verbalized understanding of instructions. All questions and concerns answered.

## 2015-07-28 ENCOUNTER — Encounter (HOSPITAL_COMMUNITY): Payer: Self-pay

## 2015-08-04 ENCOUNTER — Encounter (HOSPITAL_COMMUNITY): Payer: Self-pay

## 2015-08-17 ENCOUNTER — Telehealth: Payer: Self-pay | Admitting: Genetic Counselor

## 2015-08-17 NOTE — Telephone Encounter (Signed)
genetic appt-s/w patient and gave appt for 01/17 @ 10 w/Kayla Boggs

## 2015-08-24 ENCOUNTER — Encounter: Payer: Self-pay | Admitting: Genetic Counselor

## 2015-08-24 ENCOUNTER — Other Ambulatory Visit: Payer: Self-pay

## 2015-08-24 ENCOUNTER — Ambulatory Visit (HOSPITAL_BASED_OUTPATIENT_CLINIC_OR_DEPARTMENT_OTHER): Payer: Self-pay | Admitting: Genetic Counselor

## 2015-08-24 DIAGNOSIS — C189 Malignant neoplasm of colon, unspecified: Secondary | ICD-10-CM

## 2015-08-24 DIAGNOSIS — Z86010 Personal history of colonic polyps: Secondary | ICD-10-CM

## 2015-08-24 DIAGNOSIS — Z800 Family history of malignant neoplasm of digestive organs: Secondary | ICD-10-CM

## 2015-08-24 DIAGNOSIS — Z809 Family history of malignant neoplasm, unspecified: Secondary | ICD-10-CM

## 2015-08-24 NOTE — Progress Notes (Signed)
REFERRING PROVIDER: [name], MD  PRIMARY PROVIDER:  No PCP Per Patient  PRIMARY REASON FOR VISIT:  1. Malignant neoplasm of colon, unspecified part of colon ([place])   2. History of colonic polyps   3. Family history of colon cancer   4. Family history of cancer      HISTORY OF PRESENT ILLNESS:   Edgar [name], a 55 y.o. male, was seen for a [facility] cancer genetics consultation at the request of Dr. Marcello Moores due to a personal history of colon cancer with abnormal tumor studies and family history of colon cancer.  Edgar [name] presents to clinic today to discuss the possibility of a hereditary predisposition to cancer, genetic testing, and to further clarify his future cancer risks, as well as potential cancer risks for family members.   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 This was treated with surgical resection.  Immunohistochemistry demonstrated a loss of expression of MLH1 and PMS2 in the tumor (less than 5% expression for both) and microsatellite instability testing demonstrated high instability.  BRAF testing was also performed and no mutation was detected (normal result).     CANCER HISTORY:   No history exists.     RISK FACTORS:  Colonoscopy: this was his first colonoscopy; abnormal. Up to date with prostate exams:  no, has not yet had any prostate screening. 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 Hepatitis C 1990s  . Microcytic anemia 02/2015  . Mass of colon 06/2015  . Malignant neoplasm of splenic flexure ([place])   . Transfusion history     3 units transfused 06-29-15    Past Surgical History  Procedure Laterality  Date  . Skin graft to repair injury to right thumb.    . Esophagogastroduodenoscopy (egd) with propofol N/A 06/30/2015    Procedure: ESOPHAGOGASTRODUODENOSCOPY (EGD) WITH PROPOFOL;  Surgeon: Irene Shipper, MD;  Location: [facility];  Service: Endoscopy;  Laterality: N/A;  . Colonoscopy with propofol N/A 06/30/2015    Procedure: COLONOSCOPY WITH PROPOFOL;  Surgeon: Irene Shipper, MD;  Location: [facility];  Service: Endoscopy;  Laterality: N/A;  . Laparoscopic partial colectomy N/A 07/16/2015    Procedure: LAPAROSCOPIC TOTAL ABDOMINAL COLECTOMY ;  Surgeon: [name], MD;  Location: [facility];  Service: General;  Laterality: N/A;    Social History   Social History  . Marital Status: Single    Spouse Name: N/A  . Number of Children: N/A  . Years of Education: N/A   Occupational History  . Cabin crew    Social History Main Topics  . Smoking status: Current Every Day Smoker -- 0.25 packs/day for 30 years    Types: Cigarettes  . Smokeless tobacco: Never Used     Comment: Pack last about a week.  . Alcohol Use: No     Comment: stopped recently.  . Drug Use: Yes    Special: Marijuana, LSD, Cocaine, Methamphetamines     Comment: Quit-age 48's -, Marijuana none in 30 yrs, Cocaine-none in 7-8 yrs.  . Sexual Activity: Not Asked   Other Topics Concern  . None   Social History Narrative     FAMILY HISTORY:  We  obtained a detailed, 4-generation family history.  Significant diagnoses are listed below: Family History  Problem Relation Age of Onset  . Other Sister 53    history of hysterectomy due to abdominal pain  . Colon cancer Maternal Uncle     dx. mid-50s  . Asthma Maternal Grandfather   . Cancer Paternal Grandmother     unspecified  . Diabetes Paternal Grandfather   . Heart Problems Maternal Uncle   . Other Cousin     abdominal issues requiring surgery (male maternal 1st cousin)    Edgar [name] has one daughter who is currently 59 and has never had cancer.  He has  one granddaughter who is currently 7.  Edgar [name] has two full sisters and two full brothers, all in their 54s-early 16s and one maternal half-brother who is currently 38.  None of his siblings have ever been diagnosed with cancer, but one sister has a history of a hysterectomy in her early 43s due to abdominal pain.  Some of his siblings may not yet have had colonoscopies, but one brother, currently 1, has an upcoming colonoscopy scheduled.  He is unaware of any colon polyps in his siblings or parents.  Ms. [name] parents are both in their mid-late 50s and, he reports, are on a 5-year colonoscopy schedule.  Neither parent has ever been diagnosed with cancer.    Edgar [name] mother has three full brothers and two full sisters.  One brother died of colon cancer in his mid-2s.  This brother had no children.  Another brother died of heart problems at 18.  The remaining brother and sisters are all in their early 38s and have never had cancer.  One male maternal first cousin had an abdominal surgery, but Edgar [name] is unsure of the reason for that.  Edgar [name] maternal grandmother passed away during childbirth probably around the time when his mother was about 51-36 years of age.  Edgar [name] maternal grandfather died at a later age in life from either an asthma attack or alcohol-related causes.  Edgar [name] father had four full brothers and three full sisters.  All siblings are currently living and in their 67s-70s, except one sister who died in her early 7s from an unspecified cause.  Edgar [name] has limited information for some paternal cousins, but is unaware of any cancer.  Edgar [name] paternal grandmother died from an unknown type of cancer at 3.  His paternal grandfather died "a while ago" from diabetes or other health issues, but not from cancer. He has no further information for any paternal great aunts/uncles or great grandparents.   Patient's maternal ancestors are of [place]  American descent, and paternal ancestors are of [place] American and Native American descent. There is no reported Ashkenazi Jewish ancestry. There is no known consanguinity.  GENETIC COUNSELING ASSESSMENT: Edgar [name] is a 55 y.o. male with a personal and family history of colon cancer which is somewhat suggestive of a hereditary colon cancer syndrome and predisposition to cancer. We, therefore, discussed and recommended the following at today's visit.   DISCUSSION: We reviewed the characteristics, features and inheritance patterns of hereditary cancer syndromes. We also discussed genetic testing, including the appropriate family members to test, the process of testing, insurance coverage and turn-around-time for results. We discussed the implications of a negative, positive and/or variant of uncertain significant result. We recommended Edgar [name] pursue genetic testing for the 28-gene myRisk Hereditary Cancer Panel through [facility].  The [facility] gene panel  offered by [facility] ([place], [place]) includes sequencing and/or deletion/duplication testing of the following 28 genes: APC, ATM, BARD1, BMPR1A, BRCA1, BRCA2, BRIP1, CHD1, CDK4, CDKN2A, CHEK2, EPCAM (large rearrangement only), GREM1, MLH1, MSH2, MSH6, MUTYH, NBN, PALB2, PMS2, POLD1, POLE, PTEN, RAD51C, RAD51D, SMAD4, STK11, and TP53.   Based on Edgar [name]'s personal and family history of cancer, he meets medical criteria for genetic testing. Despite that he meets criteria, he may still have an out of pocket cost. Based on Edgar [name]'s current lack of insurance, household size, and annual gross household income, we discussed submitting this testing through [facility] for patients who have no insurance and whom meet financial criteria.  If Edgar [name] is found not to qualify for this program/is found to have an out of pocket cost of any amount for genetic  testing, we will ask that the lab call to verify testing prior to proceeding.  PLAN: After considering the risks, benefits, and limitations, Edgar [name]  provided informed consent to pursue genetic testing and the blood sample was sent to [facility] for analysis of the 28-gene [facility] Hereditary Cancer Panel. Results should be available within approximately 2-3 weeks' time, at which point they will be disclosed by telephone to Mr. Senkbeil, as will any additional recommendations warranted by these results. Mr. Murata will receive a summary of his genetic counseling visit and a copy of his results once available. This information will also be available in Epic. We encouraged Mr. Winslow to remain in contact with cancer genetics annually so that we can continuously update the family history and inform him of any changes in cancer genetics and testing that may be of benefit for his family. Mr. Kerby questions were answered to his satisfaction today. Our contact information was provided should additional questions or concerns arise.  Thank you for the referral and allowing [place] to share in the care of your patient.   Jeanine Luz, MS Genetic Counselor [email or web address] Phone: [phone]  The patient was seen for a total of 60 minutes in face-to-face genetic counseling.  This patient was discussed with Drs. Magrinat, Lindi Adie and/or Burr Medico who agrees with the above.    _______________________________________________________________________ For Office Staff:  Number of people involved in session: 1 Was an Intern/ student involved with case: no

## 2015-09-06 ENCOUNTER — Telehealth: Payer: Self-pay | Admitting: Genetic Counselor

## 2015-09-07 NOTE — Telephone Encounter (Signed)
Discussed with Edgar [name] that his genetic test result was negative for pathogenic mutations within any of 28 genes that would cause him to be at an increased genetic risk for colon polyps, colorectal cancer, or other related cancers.  Additionally, no uncertain changes were found.  Discussed that this is likely a reassuring result because many of Edgar [name] relatives, including his parents, have lived to later ages in life and have never been diagnosed with cancer.  Most cancers happen by chance.  Discussed that his immediate family members are still considered to be at a somewhat increased risk for colon cancer.  His first degree relatives (including his siblings and his daughter) can begin colonoscopy starting at age 2 instead of age 47 based on his diagnosis before the age of 8.  Edgar [name] should continue to follow his doctor's recommendations for future cancer screening.  He is welcome to call or email with any questions.

## 2015-09-08 ENCOUNTER — Ambulatory Visit: Payer: Self-pay | Admitting: Genetic Counselor

## 2015-09-08 DIAGNOSIS — C189 Malignant neoplasm of colon, unspecified: Secondary | ICD-10-CM

## 2015-09-08 DIAGNOSIS — Z1379 Encounter for other screening for genetic and chromosomal anomalies: Secondary | ICD-10-CM

## 2015-09-08 DIAGNOSIS — Z800 Family history of malignant neoplasm of digestive organs: Secondary | ICD-10-CM

## 2015-09-16 DIAGNOSIS — Z1379 Encounter for other screening for genetic and chromosomal anomalies: Secondary | ICD-10-CM | POA: Insufficient documentation

## 2015-09-16 NOTE — Progress Notes (Signed)
GENETIC TEST RESULT  HPI: Edgar [name] was previously seen in the [facility] clinic due to a personal history of colon cancer with abnormal MSI/IHC results, personal history of colon polyps, family history of colon cancer, and concerns regarding Lynch syndrome or another hereditary colon cancer syndrome. Please refer to our prior cancer genetics clinic note from August 24, 2015 for more information regarding Edgar [name]'s medical, social and family histories, and our assessment and recommendations, at the time. Edgar [name] recent genetic test results were disclosed to him, as were recommendations warranted by these results. These results and recommendations are discussed in more detail below.  GENETIC TEST RESULTS: At the time of Edgar [name] visit on 08/24/15, we recommended he pursue genetic testing of the 28-gene COLARIS [facility] Hereditary Cancer Panel through [facility] ([place]). The 28-gene myRisk Hereditary Cancer Panel includes sequencing and/or rearrangement analysis of the following genes: APTC, ATM, BARD1, BMPR1A, BRCA1, BRCA2, BRIP1, CDH1, CDK4, CDKN2A, CHEK2, EPCAM (large rearrangement only), MLH1, MSH2, MSH6, MUTYH, NBN, PALB2, PMS2, PTEN, RAD51C, RAD51D, SMAD4, STK11, and TP53.  Sequencing was also performed for select regions of two genes: POLE and POLD1, and large rearrangement analysis was performed for select regions of one gene, GREM1.  Those results are now back, the report date for which is September 03, 2015.  Genetic testing was normal, and did not reveal a deleterious mutation in these genes.  Additionally, no variants of uncertain significance (VUSes) were found.  The test report will be scanned into EPIC and will be located under the Results Review tab in the Pathology>Molecular Pathology section.   We discussed with Edgar [name] that since the current genetic testing is not perfect, it is possible there may be a gene  mutation in one of these genes that current testing cannot detect, but that chance is small. We also discussed, that it is possible that another gene that has not yet been discovered, or that we have not yet tested, is responsible for the cancer diagnoses in the family, and it is, therefore, important to remain in touch with cancer genetics in the future so that we can continue to offer Edgar [name] the most up-to-date genetic testing.   CANCER SCREENING RECOMMENDATIONS: This result is reassuring and indicates that Mr. Nelon likely does not have an increased risk for a future cancer due to a mutation in one of these genes. This normal test also suggests that Edgar [name]'s cancer was most likely not due to an inherited predisposition associated with one of these genes.  Additionally, many of Edgar [name] family members including his parents, have lived to later ages and have never been diagnosed with cancer.  Most cancers happen by chance and this negative test suggests that his cancer falls into this category.  We, therefore, recommended he continue to follow the cancer management and screening guidelines provided by his oncology and primary healthcare providers.   RECOMMENDATIONS FOR FAMILY MEMBERS: Family members may be at a somewhat increased risk for colon cancer simply due to the family history of colon cancer. All family members should have a colonoscopy by age 65.  Edgar [name] first-degree relatives (parents, siblings, and children) can begin colonoscopy screening at the age of 31, if they have not done so yet, based on Mr. Scaduto's having been diagnosed with colon cancer under the age of 52.   FOLLOW-UP: Lastly, we discussed with Edgar [name] that cancer genetics is a rapidly advancing field and it is  possible that new genetic tests will be appropriate for him and/or his family members in the future. We encouraged him to remain in contact with cancer genetics on an annual basis so we can  update his personal and family histories and let him know of advances in cancer genetics that may benefit this family.   Our contact number was provided. Edgar [name] questions were answered to his satisfaction, and she knows he is welcome to call us at anytime with additional questions or concerns.   Jeanine Luz, MS Genetic Counselor Plumer [email or web address] Phone: [phone]

## 2015-10-21 ENCOUNTER — Other Ambulatory Visit: Payer: Self-pay | Admitting: Internal Medicine

## 2015-11-12 ENCOUNTER — Telehealth: Payer: Self-pay | Admitting: Internal Medicine

## 2015-11-12 NOTE — Telephone Encounter (Signed)
APPT. REMINDER CALL, LMTCB °

## 2015-11-15 ENCOUNTER — Ambulatory Visit: Payer: Self-pay | Admitting: Internal Medicine

## 2015-11-16 ENCOUNTER — Encounter: Payer: Self-pay | Admitting: Internal Medicine

## 2015-11-22 ENCOUNTER — Encounter: Payer: Self-pay | Admitting: Internal Medicine

## 2015-11-22 ENCOUNTER — Ambulatory Visit (INDEPENDENT_AMBULATORY_CARE_PROVIDER_SITE_OTHER): Payer: Self-pay | Admitting: Internal Medicine

## 2015-11-22 VITALS — BP 132/75 | HR 79 | Temp 98.2°F | Wt 186.9 lb

## 2015-11-22 DIAGNOSIS — G479 Sleep disorder, unspecified: Secondary | ICD-10-CM

## 2015-11-22 DIAGNOSIS — F17210 Nicotine dependence, cigarettes, uncomplicated: Secondary | ICD-10-CM

## 2015-11-22 DIAGNOSIS — H538 Other visual disturbances: Secondary | ICD-10-CM

## 2015-11-22 DIAGNOSIS — C185 Malignant neoplasm of splenic flexure: Secondary | ICD-10-CM

## 2015-11-22 DIAGNOSIS — G4761 Periodic limb movement disorder: Secondary | ICD-10-CM

## 2015-11-22 LAB — BASIC METABOLIC PANEL
Anion gap: 7 (ref 5–15)
BUN: 9 mg/dL (ref 6–20)
CALCIUM: 9.3 mg/dL (ref 8.9–10.3)
CO2: 27 mmol/L (ref 22–32)
Chloride: 106 mmol/L (ref 101–111)
Creatinine, Ser: 1.23 mg/dL (ref 0.61–1.24)
GFR calc Af Amer: >60 mL/min (ref >60)
GLUCOSE: 103 mg/dL — AB (ref 65–99)
POTASSIUM: 4.2 mmol/L (ref 3.5–5.1)
Sodium: 140 mmol/L (ref 135–145)

## 2015-11-22 MED ORDER — PRAMIPEXOLE DIHYDROCHLORIDE 0.25 MG PO TABS: 0.2500 mg | ORAL_TABLET | Freq: Every day | ORAL | Status: DC

## 2015-11-22 MED ORDER — TRAMADOL HCL 50 MG PO TABS: 50.0000 mg | ORAL_TABLET | Freq: Every evening | ORAL | Status: AC | PRN

## 2015-11-22 MED ORDER — PRAMIPEXOLE DIHYDROCHLORIDE 0.125 MG PO TABS: 0.1250 mg | ORAL_TABLET | Freq: Every day | ORAL | Status: DC

## 2015-11-22 NOTE — Assessment & Plan Note (Signed)
Since his surgery, he has noticed brief 'flashes' of light, initially in his left eye only, now in both eyes. It occurs every few days, occurs for a 'split second' when he turns his head quickly. He noticed this during his hospitalization back in December and was told it was a side effect of the morphine. However, these symptoms persist. He says it is only a nuisance, nothing more. He denies associated headache, vision changes, floaters, eye pain, eye redness, any trauma to either eye, any seizure-like activity, nausea, vomiting, or other associated symptoms. These may reflect photophobia or visual hallucinations, but lack of associated symptoms and clinical course suggest that this is not a complex migraine or seizure aura and not retinal detachment. -Consider ophthalmology referral once patient meets with Marlana Latus -Appointment with Marlana Latus made today; follow-up this status at next visit

## 2015-11-22 NOTE — Assessment & Plan Note (Signed)
Patient notes that for several months, he has been unable to sleep more than 1-2 hours at a time due to restlessness and pain that he is unable to describe that initially started in his bilateral lower extremities, but for the past few months has occurred in his upper extremities as well. He has tried numerous sleep aids, all of which make him more drowsy but don't help him sleep due to the restlessness. He says he practices good sleep hygiene, denying eating, drinking, watching TV, or other activities right before bed. 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 He knows this was prescribed for post-operative abdominal pain and so he only takes tramadol every few days when he has "only slept 4 hours in 4 days". He denies any other anxiety-related or depressive symptoms, and no recent or clear stressors (this was an issue well before his cancer diagnosis as well). I think this is most likely a periodic limb movement disorder, with somatic symptoms related to anxiety being another but secondary diagnosis. -Re-check CBC, BMP, ferritin - patient currently on iron supplements daily and with h/o iron-deficiency anemia 2/2 colon cancer - will see if iron replete as anemia is associated with RLS. Consider dc'ing iron supplements if iron replete -Start pramipexole 0.125mg  QHS, to take 90-120 minutes before bedtime. -Filled 1 month supply of tramadol 50mg  QHS, only to use if symptoms severe -Encouraged good sleep hygiene and smoking cessation -RTC in 1 month, consider increase to 0.25mg  QHS if symptoms are not improved

## 2015-11-22 NOTE — Progress Notes (Signed)
   Patient ID: LUISMARIO RENZ male   DOB: 1961-04-19 55 y.o.   MRN: [id number]  Subjective:   HPI: Mr.Ina C Worch is a 55 y.o. with PMH of hepatitis C and colorectal cancer s/p total colectomy 07/2015 who presents to [facility] today for routine follow-up and for evaluation of insomnia.   Please see problem-based charting for status of medical issues pertinent to this visit.  Review of Systems: Pertinent items noted in HPI and remainder of comprehensive ROS otherwise negative.  Objective:  Physical Exam: Filed Vitals:   11/22/15 0911  BP: 132/75  Pulse: 79  Temp: 98.2 F (36.8 C)  TempSrc: Oral  Weight: 186 lb 14.4 oz (84.777 kg)  SpO2: 100%   Gen: Well-appearing, alert and oriented to person, place, and time HEENT: Oropharynx clear without erythema or exudate.  Neck: No cervical LAD, no thyromegaly or nodules, no JVD noted. CV: Normal rate, regular rhythm, no murmurs, rubs, or gallops Pulmonary: Normal effort, CTA bilaterally, no wheezing, rales, or rhonchi Abdominal: Soft, non-tender, non-distended, without rebound, guarding, or masses Extremities: Distal pulses 2+ in upper and lower extremities bilaterally, no tenderness, erythema or edema Skin: No atypical appearing moles. No rashes  Assessment & Plan:  Please see problem-based charting for assessment and plan.  Blane Ohara, MD Resident Physician, PGY-1 Department of Internal Medicine [facility]

## 2015-11-22 NOTE — Progress Notes (Signed)
Case discussed with Dr. Kennedy at the time of the visit.  We reviewed the resident's history and exam and pertinent patient test results.  I agree with the assessment, diagnosis and plan of care documented in the resident's note. 

## 2015-11-22 NOTE — Assessment & Plan Note (Addendum)
Patient has been having intermittent soft bowel movements, initially ~10 times/day after the total colectomy but now much decreased to 3-4 times/day with his diet. He initially had hematochezia prior to colectomy and shortly thereafter, which has resolved almost entirely, although he said he did notice a faint amount of blood in his stool one time last week. Otherwise, he denies any hematochezia whatsoever or any other associated symptoms. His abdominal pain has resolved. He also recently had genetic testing done due to his early age at diagnosis which was essentially normal, with recs for 1st degree relatives to undergo colonoscopy at 9 years age. -Follow-up with CRS next week -Referral placed for colonoscopy today; due per CRS recs -Check CBC, ferritin here

## 2015-11-22 NOTE — Assessment & Plan Note (Signed)
Continuing to smoke, now about 1 pack per week. Given his recent cancer diagnosis and surgery for it, he is motivated to quit but does not want any pharmaceutical assistance at this time. -Continue to counsel, recommended absolute cessation today

## 2015-11-23 LAB — CBC
HEMATOCRIT: 35.9 % — AB (ref 37.5–51.0)
HEMOGLOBIN: 12.2 g/dL — AB (ref 12.6–17.7)
MCH: 29.1 pg (ref 26.6–33.0)
MCHC: 34 g/dL (ref 31.5–35.7)
MCV: 86 fL (ref 79–97)
Platelets: 269 10*3/uL (ref 150–379)
RBC: 4.19 x10E6/uL (ref 4.14–5.80)
RDW: 17.1 % — ABNORMAL HIGH (ref 12.3–15.4)
WBC: 7.7 10*3/uL (ref 3.4–10.8)

## 2015-11-23 LAB — FERRITIN: Ferritin: 69 ng/mL (ref 30–400)

## 2015-12-22 ENCOUNTER — Encounter: Payer: Self-pay | Admitting: Internal Medicine

## 2015-12-22 ENCOUNTER — Ambulatory Visit: Payer: Self-pay | Admitting: Internal Medicine

## 2016-09-19 ENCOUNTER — Encounter (HOSPITAL_COMMUNITY): Payer: Self-pay

## 2016-10-27 ENCOUNTER — Encounter (HOSPITAL_COMMUNITY): Payer: Self-pay

## 2016-11-13 ENCOUNTER — Emergency Department (HOSPITAL_COMMUNITY)
Admission: EM | Admit: 2016-11-13 | Discharge: 2016-11-13 | Disposition: A | Discharge status: Home / Self Care | Payer: Self-pay | Attending: Emergency Medicine | Admitting: Emergency Medicine

## 2016-11-13 ENCOUNTER — Encounter (HOSPITAL_COMMUNITY): Payer: Self-pay

## 2016-11-13 DIAGNOSIS — F17210 Nicotine dependence, cigarettes, uncomplicated: Secondary | ICD-10-CM | POA: Insufficient documentation

## 2016-11-13 DIAGNOSIS — R21 Rash and other nonspecific skin eruption: Secondary | ICD-10-CM | POA: Insufficient documentation

## 2016-11-13 DIAGNOSIS — Z85038 Personal history of other malignant neoplasm of large intestine: Secondary | ICD-10-CM | POA: Insufficient documentation

## 2016-11-13 MED ORDER — PREDNISONE 50 MG PO TABS: ORAL_TABLET | ORAL | 0 refills | Status: DC

## 2016-11-13 NOTE — ED Provider Notes (Signed)
[facility] DEPT Provider Note   CSN: [id number] Arrival date & time: 11/13/16  1818   By signing my name below, I, Delton Prairie, attest that this documentation has been prepared under the direction and in the presence of  Debroah Baller, NP. Electronically Signed: Delton Prairie, ED Scribe. 11/13/16. 8:24 PM.   History   Chief Complaint Chief Complaint  Patient presents with  . Rash    HPI Comments:  Edgar [name] is a 56 y.o. male, with a PMHx of colon cancer, who presents to the Emergency Department complaining of persistent, gradually worsening rash with associated itching to his buttocks, all 4 extremities and scalp x 4 weeks. Pt states his rash initially appeared to his bilateral hips and then spread all over after he stopped taking his vitamins s/p a blood transfusion 1 year ago. No alleviating or aggravating factors noted. Pt denies a hx of psoriasis, hx of eczema, sore throat, ear pain or any other associated symptoms. Tetanus status unknown. Pt also reports a healing burn to his right wrist which he has self treated with mild relief. Pt denies any drainage from the burn. No other complaints noted at this time.    The history is provided by the patient. No language interpreter was used.  Rash   This is a new problem. The current episode started more than 1 week ago. The problem has been gradually worsening. The problem is associated with an unknown factor. There has been no fever. The rash is present on the left buttock. The pain is moderate. Associated symptoms include itching. He has tried nothing for the symptoms. The treatment provided no relief.    Past Medical History:  Diagnosis Date  . Hepatitis C 1990s  . 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 Mass of colon 06/2015  . Microcytic anemia 02/2015  . Transfusion history    3 units transfused 06-29-15    Patient Active Problem List   Diagnosis Date Noted  . Flashing lights seen 11/22/2015  . 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 Genetic testing 09/16/2015  . Family history of colon cancer 08/24/2015  . 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 Cigarette smoker 06/29/2015  . Anemia due to gastrointestinal blood loss 06/29/2015    Past Surgical History:  Procedure Laterality Date  . COLONOSCOPY WITH PROPOFOL N/A 06/30/2015   Procedure: COLONOSCOPY WITH PROPOFOL;  Surgeon: Irene Shipper, MD;  Location: [facility];  Service: Endoscopy;  Laterality: N/A;  . ESOPHAGOGASTRODUODENOSCOPY (EGD) WITH PROPOFOL N/A 06/30/2015   Procedure: ESOPHAGOGASTRODUODENOSCOPY (EGD) WITH PROPOFOL;  Surgeon: Irene Shipper, MD;  Location: [facility];  Service: Endoscopy;  Laterality: N/A;  . LAPAROSCOPIC PARTIAL COLECTOMY N/A 07/16/2015   Procedure: LAPAROSCOPIC TOTAL ABDOMINAL COLECTOMY ;  Surgeon: [name], MD;  Location: [facility];  Service: General;  Laterality: N/A;  . Skin graft to repair injury to right thumb.         Home Medications    Prior to Admission medications   Medication Sig Start Date End Date Taking? Authorizing Provider  ferrous sulfate 325 (65 FE) MG tablet TAKE 1 TABLET (325 MG TOTAL) BY MOUTH 2 (TWO) TIMES DAILY. 10/22/15   Collier Salina, MD  pramipexole (MIRAPEX) 0.125 MG tablet Take 1 tablet (0.125 mg total) by mouth at bedtime. 11/22/15 11/21/16  Norval Gable, MD  predniSONE (DELTASONE) 50 MG tablet Take one tablet PO daily 11/13/16   Deshae Dickison Bunnie Pion, NP  traMADol Veatrice Bourbon) 50  MG tablet Take 1 tablet (50 mg total) by mouth at bedtime as needed. 11/22/15   Norval Gable, MD    Family History Family History  Problem Relation Age of Onset  . Other Sister 57    history of hysterectomy due to abdominal pain  . Colon cancer Maternal Uncle     dx. mid-50s  . Asthma Maternal Grandfather   . Cancer Paternal Grandmother     unspecified  . Diabetes Paternal Grandfather   . Heart Problems Maternal Uncle   . Other Cousin     abdominal issues requiring surgery  (male maternal 1st cousin)    Social History Social History  Substance Use Topics  . Smoking status: Current Every Day Smoker    Packs/day: 0.25    Years: 30.00    Types: Cigarettes  . Smokeless tobacco: Never Used     Comment: Pack last about a week.  . Alcohol use No     Comment: stopped recently.     Allergies   Patient has no known allergies.   Review of Systems Review of Systems  HENT: Negative for ear pain and sore throat.   Skin: Positive for itching and rash.     Physical Exam Updated Vital Signs BP 131/79 (BP Location: Right Arm)   Pulse 76   Temp 99.2 F (37.3 C) (Oral)   Resp 16   Ht 6\' 3"  (1.905 m)   Wt 81.6 kg   SpO2 100%   BMI 22.50 kg/m   Physical Exam  Constitutional: He is oriented to person, place, and time. He appears well-developed and well-nourished. No distress.  HENT:  Head: Normocephalic and atraumatic.  No intraoral lesions   Eyes: Conjunctivae are normal.  Neck: Neck supple.  Cardiovascular: Normal rate.   Pulmonary/Chest: Effort normal.  Neurological: He is alert and oriented to person, place, and time.  Skin: Skin is warm and dry.  Raise plaque like areas that covers the forearms and upper legs. Healing burn to right wrist with no drainage, no signs of infection and no red streaking. Rash is not noted to the palms of the pt's hands of the soles of his feet.   Psychiatric: He has a normal mood and affect.  Nursing note and vitals reviewed.    ED Treatments / Results  DIAGNOSTIC STUDIES:  Oxygen Saturation is 100% on RA, normal by my interpretation.    COORDINATION OF CARE:  8:20 PM Discussed treatment plan with pt at bedside and pt agreed to plan.  Labs (all labs ordered are listed, but only abnormal results are displayed) Labs Reviewed - No data to display   Radiology No results found.  Procedures Procedures (including critical care time)  Medications Ordered in ED Medications - No data to  display   Initial Impression / Assessment and Plan / ED Course  I have reviewed the triage vital signs and the nursing notes. Patient with nonspecific eruption. No signs of infection. Discharge with symptomatic treatment. Follow up with dermatology. Referral given.   Discussed with the patient use of Eucerin Cream mixed with hydrocortisone cream and Benadryl PO for itching.   Final Clinical Impressions(s) / ED Diagnoses   Final diagnoses:  Rash and nonspecific skin eruption    New Prescriptions Discharge Medication List as of 11/13/2016  8:35 PM    START taking these medications   Details  predniSONE (DELTASONE) 50 MG tablet Take one tablet PO daily, Print      I personally performed the services  described in this documentation, which was scribed in my presence. The recorded information has been reviewed and is accurate.     [address] [place], NP 11/14/16 0230    Pattricia Boss, MD 11/15/16 [phone]

## 2016-11-13 NOTE — Discharge Instructions (Signed)
Get hydrocortisone cream and Eucerin Cream and mix them together and apply to the rash.

## 2016-11-13 NOTE — ED Triage Notes (Signed)
Generalized rash over entire body x 2-3 weeks. PT reports no new medications or soaps/lotions. However he think they may have switched laundry detergent d/t change in smell of clothes. No respiratory involvement

## 2017-07-04 IMAGING — CT CT ABD-PELV W/ CM
2 of 5 series · 10 of 46 positions shown, 11 images · IV contrast (Iodine)
Comparison: None.

ADDENDUM:
Upon further review and discussion with Dr. Morin, there is a
second mass within the proximal transverse colon measuring 4.3 by
3.5 cm (image 41, series 201). This mass is nearly circumferential
and most consistent with a second colonic adenocarcinoma.

Initial dictation by Dr. Prosman.  Addendum by Dr. Roblero.
Findings conveyed Gordioa, [place] 07/06/2015  at[DATE].
CLINICAL DATA: Chronic lower abdominal pain and irregular
constipation. Initial encounter.
EXAM:
CT ABDOMEN AND PELVIS WITH CONTRAST
TECHNIQUE: Multidetector CT imaging of the abdomen and pelvis was performed
using the standard protocol following bolus administration of
intravenous contrast.
CONTRAST:  100mL OMNIPAQUE IOHEXOL 300 MG/ML  SOLN

[Series 201: routine, idose (2) · axial · 0.73mm/px · z∈[+87,+452]mm · 7 of 93 slices shown, 8 images]
[im 10/93  soft-tissue]
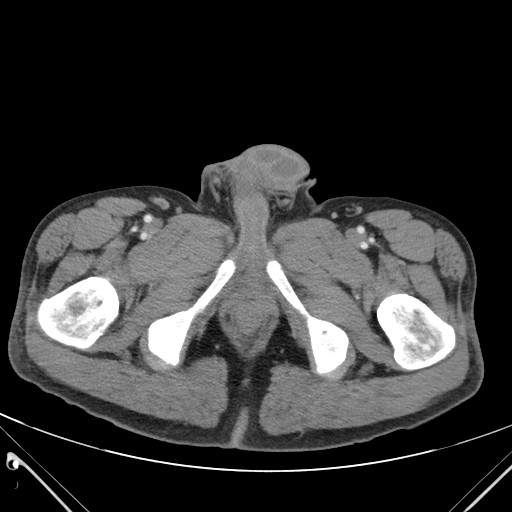
[im 10/93  bone]
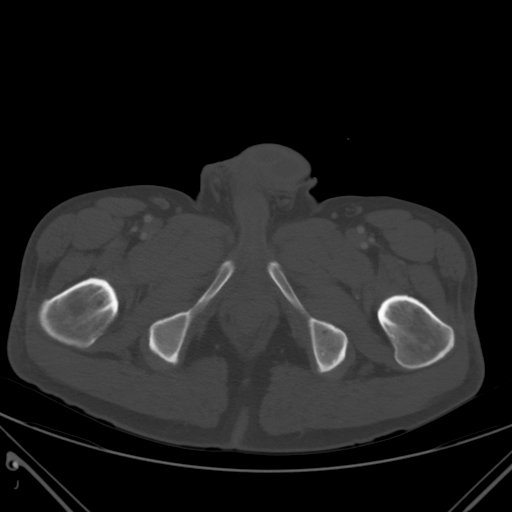
[im 24/93  soft-tissue]
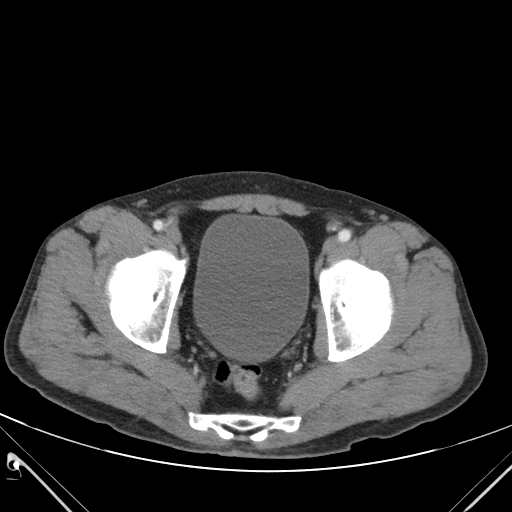
[im 33/93  soft-tissue]
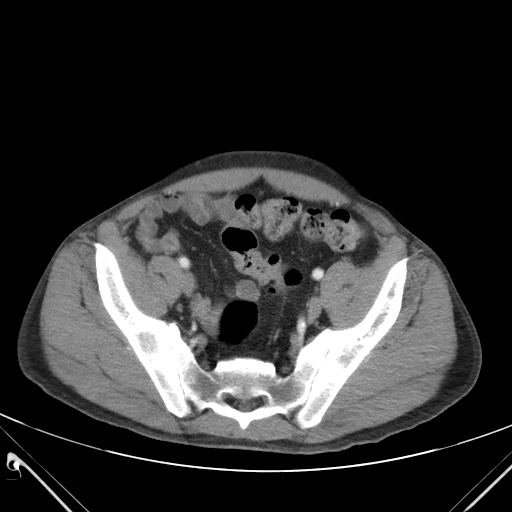
[im 47/93  soft-tissue]
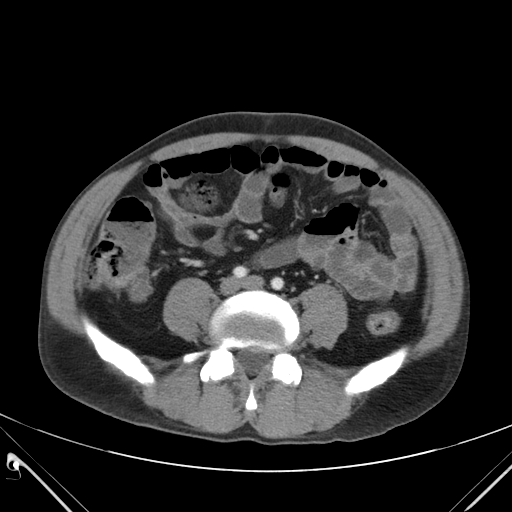
[im 60/93  soft-tissue]
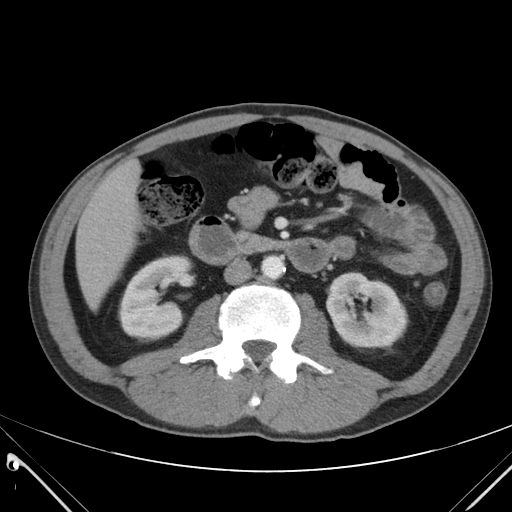
[im 70/93  soft-tissue]
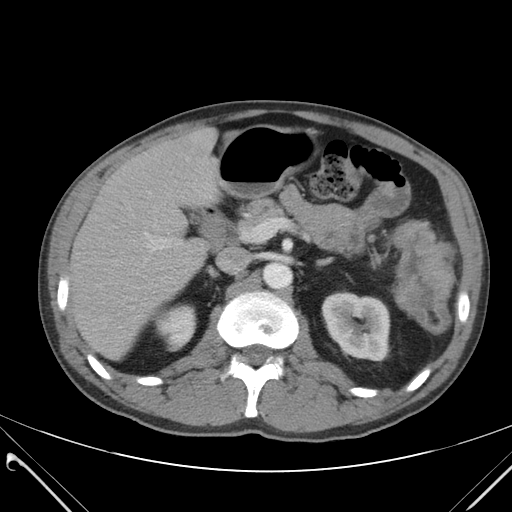
[im 83/93  soft-tissue]
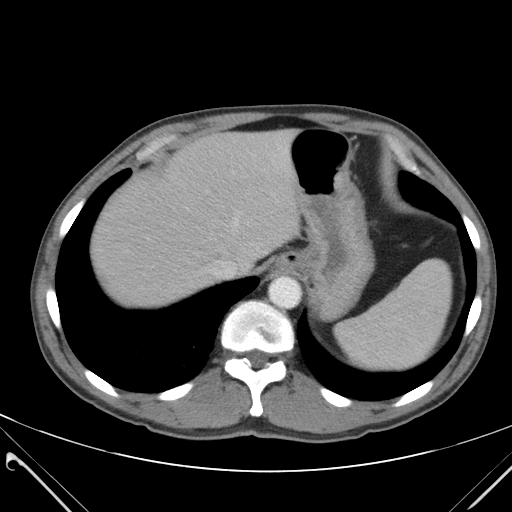

[Series 203: coronals, idose (2) · coronal · 0.45mm/px · 3 of 105 slices shown]
[im 35/105  soft-tissue]
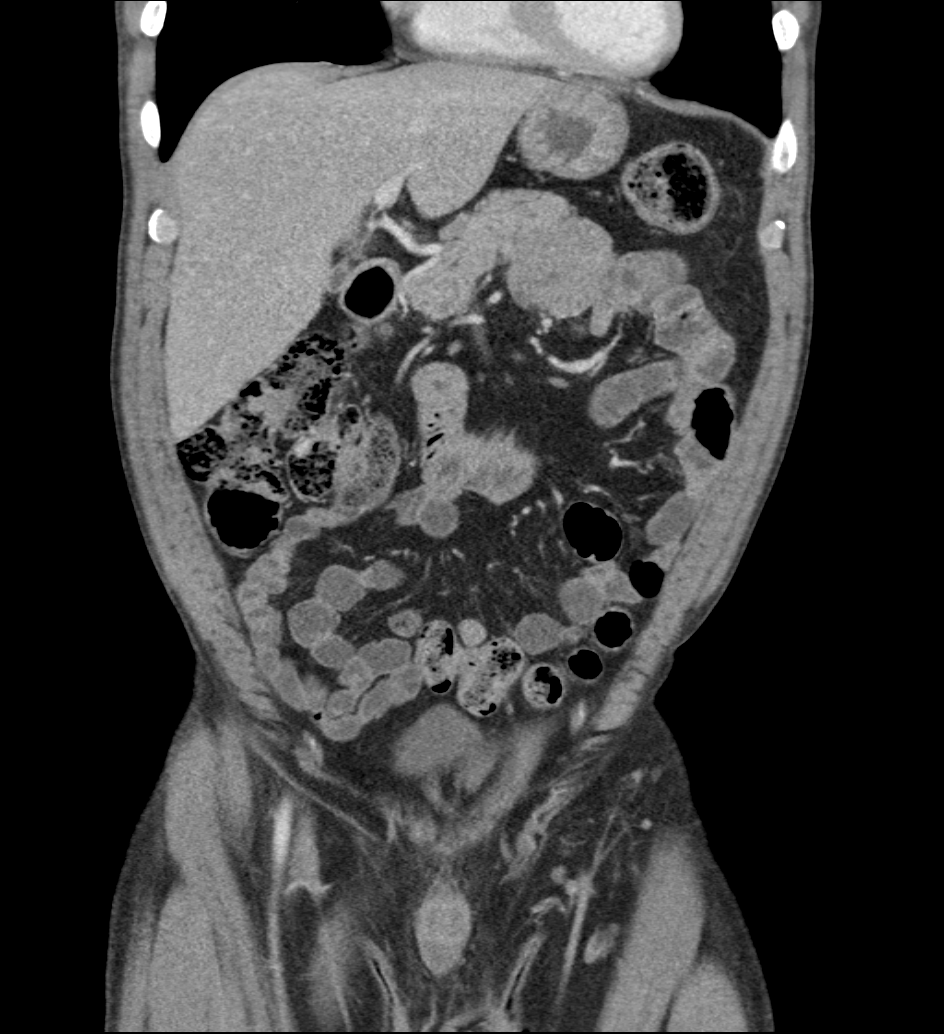
[im 47/105  soft-tissue]
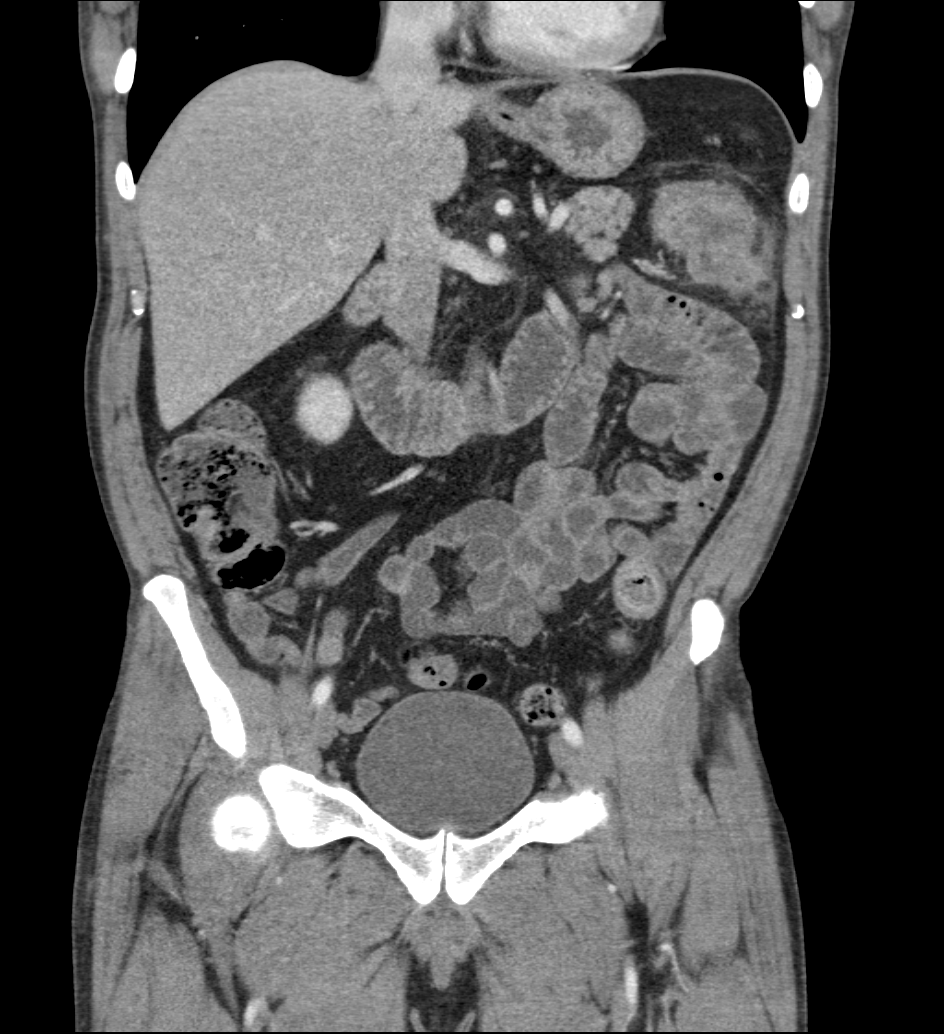
[im 58/105  soft-tissue]
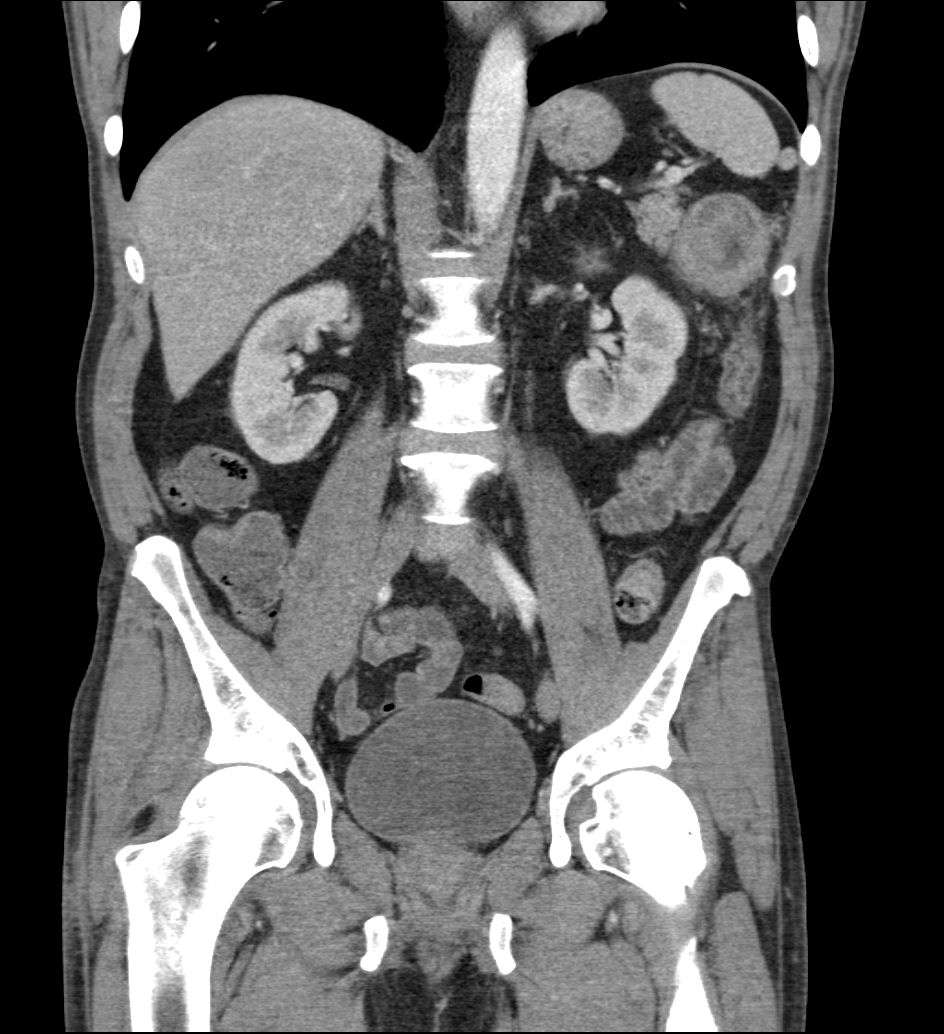

[10 of 46 positions shown; findings below may reference images not displayed]

FINDINGS: Minimal left basilar atelectasis is noted.

The liver and spleen are unremarkable in appearance. The gallbladder
is within normal limits. The pancreas and adrenal glands are
unremarkable.

Mild nonspecific perinephric stranding is noted bilaterally. The
kidneys are otherwise unremarkable. No renal or ureteral stones are
seen. There is no evidence of hydronephrosis.

No free fluid is identified. The small bowel is unremarkable in
appearance. The stomach is within normal limits. No acute vascular
abnormalities are seen. Minimal calcification is noted at the distal
abdominal aorta.

The appendix is normal in caliber and contains air, without evidence
of appendicitis.

There is segmental circumferential wall thickening noted along the
splenic flexure of the colon, measuring approximately 6.9 x 4.6 cm,
with surrounding soft tissue inflammation and trace fluid, and mild
adjacent nodularity measuring up to 1.1 cm. A few mesenteric nodes
are seen, measuring up to 6 mm in short axis. This is compatible
with primary colonic malignancy.

A nonspecific 1.0 cm nodule is noted lateral to the spleen. This may
simply reflect a splenule.

The bladder is moderately distended and grossly unremarkable in
appearance. The prostate remains normal in size. No inguinal
lymphadenopathy is seen.

No acute osseous abnormalities are identified. Vacuum phenomenon is
noted at L5-S1.
IMPRESSION: 1. 6.9 x 4.6 cm mass noted at the splenic flexure of the colon, with
circumferential wall thickening, and surrounding soft tissue
inflammation and trace fluid, compatible with prior right colonic
malignancy. Mild adjacent nodularity measures up to 1.1 cm. Few
mesenteric nodes seen, measuring up to 6 mm in short axis, likely
reflecting nodal spread of disease.
2. Nonspecific 1.0 cm nodule noted lateral to the spleen. This may
simply reflect a splenule.

## 2018-02-27 ENCOUNTER — Encounter: Payer: Self-pay | Admitting: *Deleted

## 2018-12-18 ENCOUNTER — Encounter: Payer: Self-pay | Admitting: Family Medicine

## 2018-12-18 ENCOUNTER — Other Ambulatory Visit: Payer: Self-pay

## 2018-12-18 ENCOUNTER — Ambulatory Visit: Payer: Self-pay | Attending: Family Medicine | Admitting: Family Medicine

## 2018-12-18 DIAGNOSIS — R197 Diarrhea, unspecified: Secondary | ICD-10-CM | POA: Insufficient documentation

## 2018-12-18 DIAGNOSIS — Z85038 Personal history of other malignant neoplasm of large intestine: Secondary | ICD-10-CM

## 2018-12-18 DIAGNOSIS — K529 Noninfective gastroenteritis and colitis, unspecified: Secondary | ICD-10-CM | POA: Insufficient documentation

## 2018-12-18 DIAGNOSIS — D500 Iron deficiency anemia secondary to blood loss (chronic): Secondary | ICD-10-CM

## 2018-12-18 DIAGNOSIS — K921 Melena: Secondary | ICD-10-CM | POA: Insufficient documentation

## 2018-12-18 DIAGNOSIS — G479 Sleep disorder, unspecified: Secondary | ICD-10-CM | POA: Insufficient documentation

## 2018-12-18 MED ORDER — TRAZODONE HCL 50 MG PO TABS: 25.0000 mg | ORAL_TABLET | Freq: Every evening | ORAL | 3 refills | Status: DC | PRN

## 2018-12-18 NOTE — Progress Notes (Signed)
Virtual Visit via Telephone Note  I connected with Edgar [name] on 12/18/18 at  3:30 PM EDT by telephone and verified that I am speaking with the correct person using two identifiers.   I discussed the limitations, risks, security and privacy concerns of performing an evaluation and management service by telephone and the availability of in person appointments. I also discussed with the patient that there may be a patient responsible charge related to this service. The patient expressed understanding and agreed to proceed.  Patient Location: Home Provider Location: Office Others participating in call: Emilio Aspen, RMA   History of Present Illness:       58 yo male who reports that he had colon cancer about 4 years ago for which he had surgery. He did have follow-up for a few years with GI. Patient reports that he continues to have issues with his bowels. Patient has had chronic diarrhea/loose stools since his surgery (Dec  2016-s/p total colectomy). He can manage the diarrhea by avoiding certain foods and not eating late at night. He is not sure if had removal of part or all of his colon. Patient with occasional [name] in his stool- sometimes dark but he is also on iron. He more often sees bright red [name]. Patient has been anemic in the past but no recent [name] work.  Patient also has some fatigue but he reports that he often does not sleep well.  Patient states that when he is trying to fall asleep he will have an abnormal sensation in his arms and legs.  He reports that this issue has been going on for some time, possibly before he was diagnosed with colon cancer.  He states that the only thing that he is found that helps improve his sleep is by taking tramadol at bedtime.  He reports that this medication helps him fall asleep without any symptoms and he is able to stay asleep.  He has not recently been prescribed tramadol upon further questioning but states that he has been obtaining the  medication other ways as he feels that he needs the medication to get restful sleep.      He reports some mild occasional abdominal cramping with the diarrhea but otherwise does not have any current issues with abdominal pain.  He has had no nausea, no fever or chills.  He does have a decreased appetite but he thinks that part of this is that he tries to not eat very much and the foods that he likes tend to cause him to have issues with diarrhea.  He believes that he was supposed to have follow-up with gastroenterology within the past 1 to 2 years but has not done so due to financial issues.  He would like help arranging GI follow-up due to his prior colon cancer as well as a strong family history of other family members having had cancer.  Past Medical History:  Diagnosis Date  . Hepatitis C 1990s  . 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 Mass of colon 06/2015  . Microcytic anemia 02/2015  . Transfusion history    3 units transfused 06-29-15    Past Surgical History:  Procedure Laterality Date  . COLONOSCOPY WITH PROPOFOL N/A 06/30/2015   Procedure: COLONOSCOPY WITH PROPOFOL;  Surgeon: Irene Shipper, MD;  Location: [facility];  Service: Endoscopy;  Laterality: N/A;  . ESOPHAGOGASTRODUODENOSCOPY (EGD) WITH PROPOFOL N/A 06/30/2015   Procedure: ESOPHAGOGASTRODUODENOSCOPY (EGD) WITH PROPOFOL;  Surgeon: Irene Shipper, MD;  Location: [facility];  Service: Endoscopy;  Laterality: N/A;  . LAPAROSCOPIC PARTIAL COLECTOMY N/A 07/16/2015   Procedure: LAPAROSCOPIC TOTAL ABDOMINAL COLECTOMY ;  Surgeon: [name], MD;  Location: [facility];  Service: General;  Laterality: N/A;  . Skin graft to repair injury to right thumb.      Family History  Problem Relation Age of Onset  . Other Sister 60       history of hysterectomy due to abdominal pain  . Colon cancer Maternal Uncle        dx. mid-50s  . Asthma Maternal Grandfather   . Cancer Paternal Grandmother        unspecified  . Diabetes  Paternal Grandfather   . Heart Problems Maternal Uncle   . Other Cousin        abdominal issues requiring surgery (male maternal 1st cousin)    Social History   Tobacco Use  . Smoking status: Current Every Day Smoker    Packs/day: 0.25    Years: 30.00    Pack years: 7.50    Types: Cigarettes  . Smokeless tobacco: Never Used  . Tobacco comment: Pack last about a week.  Substance Use Topics  . Alcohol use: No    Alcohol/week: 0.0 standard drinks    Comment: stopped recently.  . Drug use: Yes    Types: Marijuana, LSD, Cocaine, Methamphetamines    Comment: Quit-age 76's -, Marijuana none in 30 yrs, Cocaine-none in 7-8 yrs.     No Known Allergies     Observations/Objective: No vital signs or physical exam conducted as visit was done via telephone due to restrictions/limitations on in-person, in-office visits due to the current COVID-19 pandemic  Assessment and Plan: 1. Chronic diarrhea Patient reports issues with chronic diarrhea which he is able to partially control with the use of dietary changes.  He is unsure of the extent of his past surgery for treatment of colon cancer but on review of chart, patient is status post total colectomy for treatment of an obstructing splenic flexure cancer and a right-sided colon mass.  Patient is encouraged to remain well-hydrated and to continue to avoid foods which worsen his diarrhea.  He will be referred back to gastroenterology for further evaluation and treatment.  He will come into clinic in the next 2 to 3 weeks for lab visit for CMP and follow-up of diarrhea and CBC in follow-up of anemia/[name] in the stool, when hopefully there is less risk of COVID-19 but he is advised to wear a mask as well as gloves during social interaction that he cannot otherwise avoid. - Ambulatory referral to Gastroenterology - Comprehensive metabolic panel; Future  2. [name] in stool Patient with history of colon cancer status post total colectomy per chart  review.  Patient believes he is overdue for GI follow-up of his history of cancer and he notes that he has had onset of [name] in the stool as well as chronic diarrhea.  Patient will have CBC to look for worsening anemia due to GI [name] loss as well as referral to gastroenterology. - Ambulatory referral to Gastroenterology - CBC with Differential; Future  3. Sleep disorder Patient reports difficulty sleeping due to an abnormal sensation in his limbs when he is trying to fall asleep.  He reports that the only thing that he is found so far to help with sleep has been tramadol.  Patient states that he has not been prescribed this medication recently but has obtained it from other sources.  Patient  does not believe that he has had a prior sleep study or any formal testing to diagnose his sleep issues/periodic limb movement.  Patient was asked to try trazodone and take one half or 1 pill at bedtime to see if this helps improve his sleep.  Per chart records, patient had an office visit with the internal medicine residency program on 11/22/2015 and was prescribed a 30-day supply of tramadol for postsurgical abdominal pain and for severe sleep issues but was also started on medication, pramipexole, for treatment of presumptive restless legs syndrome/periodic limb movement.  Patient also with a history of anemia and anemia/iron deficiency can also be associated with restless legs type syndrome. - traZODone (DESYREL) 50 MG tablet; Take 0.5-1 tablets (25-50 mg total) by mouth at bedtime as needed for sleep.  Dispense: 30 tablet; Refill: 3  4. Anemia due to gastrointestinal [name] loss On review of chart, patient's last complete [name] count was done on 11/22/2015 and patient with hemoglobin of 12.2 and hematocrit of 35.9 with normal MCV of 86.  He reports prior [name] transfusion due to GI [name] loss.  Per medical records, patient has had a history of colorectal cancer which required total colectomy 07/27/2015.  CBC will be  done at an upcoming lab visit to check the status of patient's anemia.  He does report current use of a iron supplement.  He will also be referred back to gastroenterology for further evaluation. - Ambulatory referral to Gastroenterology - CBC with Differential; Future  5. History of colon cancer, 6.  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 [name].  Patient also had genetic counseling secondary to strong family history of malignancies.  Patient reports that he has not had a recent follow-up with gastroenterology but is now having symptoms of chronic diarrhea as well as [name] in the stool.  He is being referred back to gastroenterology for further evaluation and treatment.  Referral also placed for medical social worker to offer patient assistance with any available resources offered through this clinic as well as to help patient with information regarding financial assistance through this clinic. - Ambulatory referral to Gastroenterology - Ambulatory referral to Social Work - CBC with Differential; Future - Comprehensive metabolic panel; Future  Follow Up Instructions:Return in about 6 weeks (around 01/29/2019) for GI issues; 2-3 weeks for labs.    I discussed the assessment and treatment plan with the patient. The patient was provided an opportunity to ask questions and all were answered. The patient agreed with the plan and demonstrated an understanding of the instructions.   The patient was advised to call back or seek an in-person evaluation if the symptoms worsen or if the condition fails to improve as anticipated.  I provided 21 minutes of non-face-to-face time during this encounter.   Antony Blackbird, MD

## 2018-12-18 NOTE — Progress Notes (Signed)
New Patient, est care, per pt he had cancer 4 yrs ago and there are some concerns he would like to talk about.   Per pt before he had surgery, a few things started happening like his both arms starts feeling weird but not a pain. Per patient this is only at night. Per pt this has not happened for a while due to him taking pain meds for it. Per pt only Tramadol helps   Med refills due to not having refills for a while  Per pt he would like to talk about his sleep, per pt he thinks a lot when he's trying to sleep and wake up tired.

## 2018-12-22 ENCOUNTER — Encounter: Payer: Self-pay | Admitting: Family Medicine

## 2019-01-08 ENCOUNTER — Ambulatory Visit: Payer: Self-pay | Attending: Family Medicine

## 2019-01-08 ENCOUNTER — Other Ambulatory Visit: Payer: Self-pay

## 2019-01-08 DIAGNOSIS — K529 Noninfective gastroenteritis and colitis, unspecified: Secondary | ICD-10-CM

## 2019-01-08 DIAGNOSIS — Z85038 Personal history of other malignant neoplasm of large intestine: Secondary | ICD-10-CM

## 2019-01-08 DIAGNOSIS — K921 Melena: Secondary | ICD-10-CM

## 2019-01-08 DIAGNOSIS — D500 Iron deficiency anemia secondary to blood loss (chronic): Secondary | ICD-10-CM

## 2019-01-09 LAB — CBC WITH DIFFERENTIAL/PLATELET
Basophils Absolute: 0.1 x10E3/uL (ref 0.0–0.2)
Basos: 1 %
EOS (ABSOLUTE): 0.1 x10E3/uL (ref 0.0–0.4)
Eos: 1 %
Hematocrit: 40.2 % (ref 37.5–51.0)
Hemoglobin: 13.2 g/dL (ref 13.0–17.7)
Immature Grans (Abs): 0 x10E3/uL (ref 0.0–0.1)
Immature Granulocytes: 0 %
Lymphocytes Absolute: 1.5 x10E3/uL (ref 0.7–3.1)
Lymphs: 21 %
MCH: 31.6 pg (ref 26.6–33.0)
MCHC: 32.8 g/dL (ref 31.5–35.7)
MCV: 96 fL (ref 79–97)
Monocytes Absolute: 0.5 x10E3/uL (ref 0.1–0.9)
Monocytes: 8 %
Neutrophils Absolute: 4.8 x10E3/uL (ref 1.4–7.0)
Neutrophils: 69 %
Platelets: 238 x10E3/uL (ref 150–450)
RBC: 4.18 x10E6/uL (ref 4.14–5.80)
RDW: 14.4 % (ref 11.6–15.4)
WBC: 6.9 x10E3/uL (ref 3.4–10.8)

## 2019-01-09 LAB — COMPREHENSIVE METABOLIC PANEL WITH GFR
ALT: 31 IU/L (ref 0–44)
AST: 23 IU/L (ref 0–40)
Albumin/Globulin Ratio: 1.8 (ref 1.2–2.2)
Albumin: 4.6 g/dL (ref 3.8–4.9)
Alkaline Phosphatase: 64 IU/L (ref 39–117)
BUN/Creatinine Ratio: 11 (ref 9–20)
BUN: 14 mg/dL (ref 6–24)
Bilirubin Total: 0.7 mg/dL (ref 0.0–1.2)
CO2: 22 mmol/L (ref 20–29)
Calcium: 10 mg/dL (ref 8.7–10.2)
Chloride: 103 mmol/L (ref 96–106)
Creatinine, Ser: 1.25 mg/dL (ref 0.76–1.27)
GFR calc Af Amer: 73 mL/min/1.73
GFR calc non Af Amer: 64 mL/min/1.73
Globulin, Total: 2.5 g/dL (ref 1.5–4.5)
Glucose: 107 mg/dL — ABNORMAL HIGH (ref 65–99)
Potassium: 4.1 mmol/L (ref 3.5–5.2)
Sodium: 141 mmol/L (ref 134–144)
Total Protein: 7.1 g/dL (ref 6.0–8.5)

## 2019-02-05 ENCOUNTER — Ambulatory Visit: Payer: Self-pay | Attending: Family Medicine | Admitting: Family Medicine

## 2019-02-05 ENCOUNTER — Other Ambulatory Visit: Payer: Self-pay

## 2019-02-05 ENCOUNTER — Encounter: Payer: Self-pay | Admitting: Family Medicine

## 2019-02-05 DIAGNOSIS — Z85038 Personal history of other malignant neoplasm of large intestine: Secondary | ICD-10-CM

## 2019-02-05 DIAGNOSIS — G479 Sleep disorder, unspecified: Secondary | ICD-10-CM

## 2019-02-05 DIAGNOSIS — K529 Noninfective gastroenteritis and colitis, unspecified: Secondary | ICD-10-CM

## 2019-02-05 NOTE — Progress Notes (Signed)
Virtual Visit via Telephone Note  I connected with Edgar [name] on 02/05/19 at  2:10 PM EDT by telephone and verified that I am speaking with the correct person using two identifiers.   I discussed the limitations, risks, security and privacy concerns of performing an evaluation and management service by telephone and the availability of in person appointments. I also discussed with the patient that there may be a patient responsible charge related to this service. The patient expressed understanding and agreed to proceed.  Patient Location: Home Provider Location: Office Others participating in call: Emilio Aspen, RMA   History of Present Illness:      58 year old male status post recent new patient visit to establish care.  He has a history of colon cancer for which he was admitted on 07/16/2015 through 07/22/2018 to [facility] for total abdominal colectomy due to an obstructing splenic flexure cancer and right-sided colon mass.  He did return for recent [name] work after his last visit and patient had no evidence of anemia.  Complete metabolic panel was also done which was normal.  Patient states that overall he feels well but continues to have issues with chronic diarrhea that he is somewhat able to control with changes in his diet.  He denies any current issues with abdominal pain.  He denies any abnormal weight loss, no fever, chills or night sweats.  No GI bleeding.      He reports continued issues with difficulty sleeping unless he takes tramadol.  Patient states that he did try the use of trazodone and it did help improve his sleep but he states that if he takes the trazodone without the tramadol then he does not feel that he sleeps as well.  He still has abnormal sensation in his legs at night if he does not take tramadol before trying to go to sleep.        Past Medical History:  Diagnosis Date  . Hepatitis C 1990s  . Malignant neoplasm of splenic flexure ([place])   . Mass of  colon 06/2015  . Microcytic anemia 02/2015  . Transfusion history    3 units transfused 06-29-15    Past Surgical History:  Procedure Laterality Date  . COLONOSCOPY WITH PROPOFOL N/A 06/30/2015   Procedure: COLONOSCOPY WITH PROPOFOL;  Surgeon: Irene Shipper, MD;  Location: [place];  Service: Endoscopy;  Laterality: N/A;  . ESOPHAGOGASTRODUODENOSCOPY (EGD) WITH PROPOFOL N/A 06/30/2015   Procedure: ESOPHAGOGASTRODUODENOSCOPY (EGD) WITH PROPOFOL;  Surgeon: Irene Shipper, MD;  Location: [place];  Service: Endoscopy;  Laterality: N/A;  . LAPAROSCOPIC PARTIAL COLECTOMY N/A 07/16/2015   Procedure: LAPAROSCOPIC TOTAL ABDOMINAL COLECTOMY ;  Surgeon: [name], MD;  Location: [facility];  Service: General;  Laterality: N/A;  . Skin graft to repair injury to right thumb.      Family History  Problem Relation Age of Onset  . Other Sister 84       history of hysterectomy due to abdominal pain  . Colon cancer Maternal Uncle        dx. mid-50s  . Asthma Maternal Grandfather   . Cancer Paternal Grandmother        unspecified  . Diabetes Paternal Grandfather   . Heart Problems Maternal Uncle   . Other Cousin        abdominal issues requiring surgery (male maternal 1st cousin)    Social History   Tobacco Use  . Smoking status: Current Every Day Smoker    Packs/day: 0.25  Years: 30.00    Pack years: 7.50    Types: Cigarettes  . Smokeless tobacco: Never Used  . Tobacco comment: Pack last about a week.  Substance Use Topics  . Alcohol use: No    Alcohol/week: 0.0 standard drinks    Comment: stopped recently.  . Drug use: Yes    Types: Marijuana, LSD, Cocaine, Methamphetamines    Comment: Quit-age 39's -, Marijuana none in 30 yrs, Cocaine-none in 7-8 yrs.     No Known Allergies     Observations/Objective: No vital signs or physical exam conducted as visit was done via telephone  Assessment and Plan: 1. Sleep disorder Patient with continued complaint of difficulty sleeping  unless he takes tramadol.  Patient again made aware that this office would not prescribe tramadol long-term for his nonspecific leg discomfort/sensation of needing to move his legs prior to falling asleep.  He was prescribed trazodone which he reports helps but he has not tried taking this without the use of tramadol.  He is encouraged to try trazodone to see if this will help with his sleep  2. Chronic diarrhea; 3.  History of colon cancer Patient had a recent normal complete metabolic panel and normal CBC in follow-up of chronic diarrhea and history of [name] loss anemia related to colon cancer.  He will be referred to establish care/follow-up with gastroenterology regarding the chronic diarrhea and his history of colon cancer. - Ambulatory referral to Gastroenterology   Follow Up Instructions:    I discussed the assessment and treatment plan with the patient. The patient was provided an opportunity to ask questions and all were answered. The patient agreed with the plan and demonstrated an understanding of the instructions.   The patient was advised to call back or seek an in-person evaluation if the symptoms worsen or if the condition fails to improve as anticipated.  I provided 11 minutes of non-face-to-face time during this encounter.   Antony Blackbird, MD

## 2019-02-05 NOTE — Progress Notes (Signed)
Per pt his bleeding is under control and gotten better.

## 2019-02-18 ENCOUNTER — Telehealth: Payer: Self-pay | Admitting: Licensed Clinical Social Worker

## 2019-02-18 NOTE — Telephone Encounter (Signed)
Call placed to patient. LCSW introduced self and explained role at [facility]. Pt was informed of consult from PCP to address financial resources.   LCSW explained Financial Counseling and how it could be beneficial for pt to apply. Pt was encouraged to contact Financial Counselor to remove "Medicaid Pending" status in chart, to be eligible to complete financial counseling. Pt verbalized understanding and agreed to LCSW mailing application to address on file.   No additional concerns noted.

## 2019-02-25 ENCOUNTER — Encounter: Payer: Self-pay | Admitting: Internal Medicine

## 2019-04-02 ENCOUNTER — Ambulatory Visit: Payer: Self-pay | Admitting: Internal Medicine

## 2019-05-05 ENCOUNTER — Ambulatory Visit: Payer: Self-pay | Admitting: Internal Medicine

## 2019-05-05 ENCOUNTER — Encounter: Payer: Self-pay | Admitting: Internal Medicine

## 2019-05-05 ENCOUNTER — Other Ambulatory Visit: Payer: Self-pay

## 2019-05-05 VITALS — BP 128/84 | HR 87 | Temp 98.3°F | Ht 75.0 in | Wt 197.0 lb

## 2019-05-05 DIAGNOSIS — K625 Hemorrhage of anus and rectum: Secondary | ICD-10-CM

## 2019-05-05 DIAGNOSIS — R197 Diarrhea, unspecified: Secondary | ICD-10-CM

## 2019-05-05 DIAGNOSIS — Z85038 Personal history of other malignant neoplasm of large intestine: Secondary | ICD-10-CM

## 2019-05-05 NOTE — Patient Instructions (Signed)
You have been scheduled for a colonoscopy. Please follow written instructions given to you at your visit today.  Please pick up your prep supplies at the pharmacy within the next 1-3 days. If you use inhalers (even only as needed), please bring them with you on the day of your procedure.   

## 2019-05-05 NOTE — Progress Notes (Signed)
HISTORY OF PRESENT ILLNESS:  Edgar [name] is a 58 y.o. male, self-employed Database administrator, with a history of colon cancer status post subtotal colectomy December 2016 who presents today upon referral from his primary care provider Dr. Chapman [name] with chief complaints of postoperative diarrhea and recent problems with rectal bleeding.  I saw the patient on one occasion in November 2016 regarding iron deficiency anemia, abnormal CT scan, and dysphasia.  On June 30, 2015 he underwent both upper endoscopy and colonoscopy.  Upper endoscopy was normal.  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  The colonoscope could not be passed beyond this region.  He subsequently underwent laparoscopic colectomy with Dr. Marcello Moores December 2016.  In addition to the aforementioned mass lesion he was noted to have a second mass lesion in the region of the right colon.  Thus, laparoscopic-assisted subtotal colectomy with primary anastomosis.  Patient did see a Dietitian and was found NOT to have an identifiable genetic syndrome.  He tells me that he was told he did not require adjuvant therapy.  He has not had surveillance evaluation of his residual colon since.  In terms of his bowel habits, he describes 2 to 3/day which are typically loose.  Symptoms are typically exacerbated by meals.  He is on the therapy for this issue.  Next, he does notice intermittent rectal bleeding described as bright red blood typically on the tissue.  No rectal pain.  His appetite is good.  No weight loss.  Review of outside blood work from January 08, 2019 finds unremarkable comprehensive metabolic panel.  Normal liver test.  Normal CBC with hemoglobin 13.2.  Review of x-ray file shows previous CT scan 2016 preoperatively with the mass lesions of the colon.  No metastatic disease.   REVIEW OF SYSTEMS:  All non-GI ROS negative except for  Past Medical  History:  Diagnosis Date  . Hepatitis C 1990s  . Malignant neoplasm of splenic flexure (Edgar [name])   . Mass of colon 06/2015  . Microcytic anemia 02/2015  . Transfusion history    3 units transfused 06-29-15    Past Surgical History:  Procedure Laterality Date  . COLONOSCOPY WITH PROPOFOL N/A 06/30/2015   Procedure: COLONOSCOPY WITH PROPOFOL;  Surgeon: Irene Shipper, MD;  Location: [place];  Service: Endoscopy;  Laterality: N/A;  . ESOPHAGOGASTRODUODENOSCOPY (EGD) WITH PROPOFOL N/A 06/30/2015   Procedure: ESOPHAGOGASTRODUODENOSCOPY (EGD) WITH PROPOFOL;  Surgeon: Irene Shipper, MD;  Location: [place];  Service: Endoscopy;  Laterality: N/A;  . LAPAROSCOPIC PARTIAL COLECTOMY N/A 07/16/2015   Procedure: LAPAROSCOPIC TOTAL ABDOMINAL COLECTOMY ;  Surgeon: [name], MD;  Location: [facility];  Service: General;  Laterality: N/A;  . Skin graft to repair injury to right thumb.      Social History [name]  reports that he has been smoking cigarettes. He has a 7.50 pack-year smoking history. He has never used smokeless tobacco. He reports current drug use. Drugs: Marijuana, LSD, Cocaine, and Methamphetamines. He reports that he does not drink alcohol.  family history includes Asthma in his maternal grandfather; Cancer in his paternal grandmother; Colon cancer in his maternal uncle and paternal aunt; Diabetes in his paternal grandfather; Heart Problems in his maternal uncle; Other in his cousin; Other (age of onset: 68) in his sister.  No Known Allergies     PHYSICAL EXAMINATION: Vital signs: BP 128/84   Pulse 87   Temp 98.3 F (  36.8 C)   Ht 6\' 3"  (1.905 m)   Wt 197 lb (89.4 kg)   BMI 24.62 kg/m   Constitutional: generally well-appearing, no acute distress Psychiatric: alert and oriented x3, cooperative Eyes: extraocular movements intact, anicteric, conjunctiva pink Mouth: oral pharynx moist, no lesions Neck: supple no lymphadenopathy Cardiovascular: heart regular rate and  rhythm, no murmur Lungs: clear to auscultation bilaterally Abdomen: soft, nontender, nondistended, no obvious ascites, no peritoneal signs, normal bowel sounds, no organomegaly Rectal: Deferred to colonoscopy Extremities: no clubbing, cyanosis, or lower extremity edema bilaterally Skin: no lesions on visible extremities Neuro: No focal deficits.  Cranial nerves intact  ASSESSMENT:  1.  Personal history of colon cancer (splenic flexure) and large precancerous lesion right colon.  Status post subtotal colectomy 2.  Postoperative diarrhea 3.  More recent problems with intermittent rectal bleeding 4.  Reported history of hepatitis C.  Normal liver tests and negative hepatitis C antibody 3 years ago   PLAN:  1.  Schedule colonoscopy to evaluate residual colon.The nature of the procedure, as well as the risks, benefits, and alternatives were carefully and thoroughly reviewed with the patient. Ample time for discussion and questions allowed. The patient understood, was satisfied, and agreed to proceed. 2.  Suspect his diarrhea will respond to a combination of fiber and antidiarrheals.  We will advise post procedure 3.  Address bleeding based on identified cause at the time of colonoscopy 4.  Ongoing general medical care with Dr. Chapman [name]  A copy this consultation note has been sent to Dr. Chapman [name]

## 2019-05-29 ENCOUNTER — Telehealth: Payer: Self-pay

## 2019-05-29 NOTE — Telephone Encounter (Signed)
Covid-19 screening questions   Do you now or have you had a fever in the last 14 days? NO   Do you have any respiratory symptoms of shortness of breath or cough now or in the last 14 days? NO  Do you have any family members or close contacts with diagnosed or suspected Covid-19 in the past 14 days? NO  Have you been tested for Covid-19 and found to be positive? NO        

## 2019-05-30 ENCOUNTER — Ambulatory Visit (AMBULATORY_SURGERY_CENTER): Payer: Self-pay | Admitting: Internal Medicine

## 2019-05-30 ENCOUNTER — Other Ambulatory Visit: Payer: Self-pay

## 2019-05-30 ENCOUNTER — Encounter: Payer: Self-pay | Admitting: Internal Medicine

## 2019-05-30 VITALS — BP 129/77 | HR 69 | Temp 98.2°F | Resp 14 | Ht 75.0 in | Wt 197.0 lb

## 2019-05-30 DIAGNOSIS — Z85038 Personal history of other malignant neoplasm of large intestine: Secondary | ICD-10-CM

## 2019-05-30 DIAGNOSIS — K625 Hemorrhage of anus and rectum: Secondary | ICD-10-CM

## 2019-05-30 DIAGNOSIS — R197 Diarrhea, unspecified: Secondary | ICD-10-CM

## 2019-05-30 MED ORDER — SODIUM CHLORIDE 0.9 % IV SOLN: 500.0000 mL | Freq: Once | INTRAVENOUS | Status: DC

## 2019-05-30 NOTE — Progress Notes (Signed)
No problems noted in the recovery room. maw 

## 2019-05-30 NOTE — Patient Instructions (Signed)
YOU HAD AN ENDOSCOPIC PROCEDURE TODAY AT [facility]:   Refer to the procedure report that was given to you for any specific questions about what was found during the examination.  If the procedure report does not answer your questions, please call your gastroenterologist to clarify.  If you requested that your care partner not be given the details of your procedure findings, then the procedure report has been included in a sealed envelope for you to review at your convenience later.  YOU SHOULD EXPECT: Some feelings of bloating in the abdomen. Passage of more gas than usual.  Walking can help get rid of the air that was put into your GI tract during the procedure and reduce the bloating. If you had a lower endoscopy (such as a colonoscopy or flexible sigmoidoscopy) you may notice spotting of blood in your stool or on the toilet paper. If you underwent a bowel prep for your procedure, you may not have a normal bowel movement for a few days.  Please Note:  You might notice some irritation and congestion in your nose or some drainage.  This is from the oxygen used during your procedure.  There is no need for concern and it should clear up in a day or so.  SYMPTOMS TO REPORT IMMEDIATELY:   Following lower endoscopy (colonoscopy or flexible sigmoidoscopy):  Excessive amounts of blood in the stool  Significant tenderness or worsening of abdominal pains  Swelling of the abdomen that is new, acute  Fever of 100F or higher   For urgent or emergent issues, a gastroenterologist can be reached at any hour by calling [phone].   DIET:  We do recommend a small meal at first, but then you may proceed to your regular diet.  Drink plenty of fluids but you should avoid alcoholic beverages for 24 hours.  ACTIVITY:  You should plan to take it easy for the rest of today and you should NOT DRIVE or use heavy machinery until tomorrow (because of the sedation medicines used during the test).     FOLLOW UP: Our staff will call the number listed on your records 48-72 hours following your procedure to check on you and address any questions or concerns that you may have regarding the information given to you following your procedure. If we do not reach you, we will leave a message.  We will attempt to reach you two times.  During this call, we will ask if you have developed any symptoms of COVID 19. If you develop any symptoms (ie: fever, flu-like symptoms, shortness of breath, cough etc.) before then, please call [phone].  If you test positive for Covid 19 in the 2 weeks post procedure, please call and report this information to [place].    If any biopsies were taken you will be contacted by phone or by letter within the next 1-3 weeks.  Please call us at [phone] if you have not heard about the biopsies in 3 weeks.    SIGNATURES/CONFIDENTIALITY: You and/or your care partner have signed paperwork which will be entered into your electronic medical record.  These signatures attest to the fact that that the information above on your After Visit Summary has been reviewed and is understood.  Full responsibility of the confidentiality of this discharge information lies with you and/or your care-partner.    Metamucil 2 tablespoons daily in 12 ounces of water or juice.  This will give your bowels better consistency. Imodium (loperamide) tablets.  You  may take 1 or 2 tabs 3 times daily as needed for diarrhea. You may resume your current medications today. Recommend repeat colonoscopy in 5 years. Please call if any questions or concerns.

## 2019-05-30 NOTE — Progress Notes (Signed)
Pt's states no medical or surgical changes since previsit or office visit. 

## 2019-05-30 NOTE — Progress Notes (Signed)
PT taken to PACU. Monitors in place. VSS. Report given to RN. 

## 2019-05-30 NOTE — Op Note (Signed)
[place] Patient Name: Edgar [name] Procedure Date: 05/30/2019 11:45 AM MRN: [id number] Endoscopist: Docia Chuck. Henrene Pastor , MD Age: 58 Referring MD:  Date of Birth: 05-28-1961 Gender: Male Account #: [id number] Procedure:                Colonoscopy Indications:              High risk colon cancer surveillance: Personal                            history of colon cancer 2016 status post subtotal                            colectomy. First follow-up exam. Also with chronic                            postoperative diarrhea and minor rectal bleeding Medicines:                Monitored Anesthesia Care Procedure:                Pre-Anesthesia Assessment:                           - Prior to the procedure, a History and Physical                            was performed, and patient medications and                            allergies were reviewed. The patient's tolerance of                            previous anesthesia was also reviewed. The risks                            and benefits of the procedure and the sedation                            options and risks were discussed with the patient.                            All questions were answered, and informed consent                            was obtained. Prior Anticoagulants: The patient has                            taken no previous anticoagulant or antiplatelet                            agents. ASA Grade Assessment: I - A normal, healthy                            patient. After reviewing the risks and benefits,  the patient was deemed in satisfactory condition to                            undergo the procedure.                           After obtaining informed consent, the colonoscope                            was passed under direct vision. Throughout the                            procedure, the patient's blood pressure, pulse, and                            oxygen saturations were  monitored continuously. The                            Colonoscope was introduced through the anus and                            advanced to the the ileocolonic anastomosis. The                            rectum was photographed. The quality of the bowel                            preparation was excellent. The colonoscopy was                            performed without difficulty. The patient tolerated                            the procedure well. The bowel preparation used was                            SUPREP via split dose instruction. Scope In: 11:59:02 AM Scope Out: 12:02:36 PM Scope Withdrawal Time: 0 hours 2 minutes 29 seconds  Total Procedure Duration: 0 hours 3 minutes 34 seconds  Findings:                 The neo-terminal ileum appeared normal.                           There was evidence of subtotal colectomy with                            approximately 20 cm of residual colon. The                            ileocolonic anastomosis was unremarkable. The exam                            was otherwise without abnormality on direct and  retroflexion views. Small hemorrhoids present Complications:            No immediate complications. Estimated blood loss:                            None. Estimated Blood Loss:     Estimated blood loss: none. Impression:               1. Normal residual colon, ileum, and ileocolonic                            anastomosis. Status post subtotal colectomy. No                            neoplasia.                           2. 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 Metamucil 2 tablespoons daily in 12 ounces of                            water or juice. This will give your bowels better                            consistency                           2. Imodium (loperamide) tablets. You may take 1 or                            2 3 times daily as needed for diarrhea.                           3.  Recommend repeat colonoscopy in 5 years. Docia Chuck. Henrene Pastor, MD 05/30/2019 12:11:33 PM This report has been signed electronically.

## 2019-06-03 ENCOUNTER — Telehealth: Payer: Self-pay | Admitting: *Deleted

## 2019-06-03 ENCOUNTER — Telehealth: Payer: Self-pay

## 2019-06-03 NOTE — Telephone Encounter (Signed)
  Follow up Call-  Call back number 05/30/2019  Post procedure Call Back phone  # [phone]  Permission to leave phone message Yes  Some recent data might be hidden     Patient questions:  Do you have a fever, pain , or abdominal swelling? No. Pain Score  0 *  Have you tolerated food without any problems? Yes.    Have you been able to return to your normal activities? Yes.    Do you have any questions about your discharge instructions: Diet   No. Medications  No. Follow up visit  No.  Do you have questions or concerns about your Care? No.  Actions: * If pain score is 4 or above: No action needed, pain <4.  1. Have you developed a fever since your procedure? no  2.   Have you had an respiratory symptoms (SOB or cough) since your procedure? no  3.   Have you tested positive for COVID 19 since your procedure no  4.   Have you had any family members/close contacts diagnosed with the COVID 19 since your procedure?  no   If yes to any of these questions please route to Joylene John, RN and Alphonsa Gin, Therapist, sports.

## 2019-06-03 NOTE — Telephone Encounter (Signed)
  Follow up Call-  Call back number 05/30/2019  Post procedure Call Back phone  # [phone]  Permission to leave phone message Yes  Some recent data might be hidden     No answer at # given.  Unable to leave message due to full VM box.

## 2019-08-19 ENCOUNTER — Ambulatory Visit: Payer: Self-pay | Attending: Internal Medicine

## 2019-08-19 DIAGNOSIS — Z20822 Contact with and (suspected) exposure to covid-19: Secondary | ICD-10-CM | POA: Insufficient documentation

## 2019-08-21 LAB — NOVEL CORONAVIRUS, NAA: SARS-CoV-2, NAA: NOT DETECTED

## 2020-08-30 ENCOUNTER — Emergency Department (HOSPITAL_COMMUNITY)
Admission: EM | Admit: 2020-08-30 | Discharge: 2020-08-30 | Disposition: A | Discharge status: Home / Self Care | Payer: Self-pay | Attending: Emergency Medicine | Admitting: Emergency Medicine

## 2020-08-30 ENCOUNTER — Encounter (HOSPITAL_COMMUNITY): Payer: Self-pay | Admitting: Obstetrics and Gynecology

## 2020-08-30 ENCOUNTER — Other Ambulatory Visit: Payer: Self-pay

## 2020-08-30 DIAGNOSIS — H5462 Unqualified visual loss, left eye, normal vision right eye: Secondary | ICD-10-CM

## 2020-08-30 DIAGNOSIS — F17210 Nicotine dependence, cigarettes, uncomplicated: Secondary | ICD-10-CM | POA: Insufficient documentation

## 2020-08-30 DIAGNOSIS — H5712 Ocular pain, left eye: Secondary | ICD-10-CM | POA: Insufficient documentation

## 2020-08-30 DIAGNOSIS — Z85038 Personal history of other malignant neoplasm of large intestine: Secondary | ICD-10-CM | POA: Insufficient documentation

## 2020-08-30 DIAGNOSIS — H5452A1 Low vision left eye category 1, normal vision right eye: Secondary | ICD-10-CM | POA: Insufficient documentation

## 2020-08-30 MED ORDER — OXYCODONE-ACETAMINOPHEN 5-325 MG PO TABS
1.0000 | ORAL_TABLET | Freq: Once | ORAL | Status: AC
  Administered 2020-08-30: 1 via ORAL
  Filled 2020-08-30: qty 1

## 2020-08-30 MED ORDER — FLUORESCEIN SODIUM 1 MG OP STRP
1.0000 | ORAL_STRIP | Freq: Once | OPHTHALMIC | Status: AC
  Administered 2020-08-30: 1 via OPHTHALMIC
  Filled 2020-08-30: qty 1

## 2020-08-30 MED ORDER — TETRACAINE HCL 0.5 % OP SOLN
2.0000 [drp] | Freq: Once | OPHTHALMIC | Status: AC
  Administered 2020-08-30: 2 [drp] via OPHTHALMIC
  Filled 2020-08-30: qty 4

## 2020-08-30 MED ORDER — BRIMONIDINE TARTRATE 0.2 % OP SOLN: 1.0000 [drp] | Freq: Three times a day (TID) | OPHTHALMIC | 12 refills | Status: DC

## 2020-08-30 MED ORDER — BRIMONIDINE TARTRATE 0.15 % OP SOLN
1.0000 [drp] | Freq: Once | OPHTHALMIC | Status: AC
  Administered 2020-08-30: 1 [drp] via OPHTHALMIC
  Filled 2020-08-30: qty 5

## 2020-08-30 MED ORDER — TETRACAINE HCL 0.5 % OP SOLN
2.0000 [drp] | Freq: Once | OPHTHALMIC | Status: DC
  Filled 2020-08-30: qty 4

## 2020-08-30 MED ORDER — DORZOLAMIDE HCL-TIMOLOL MAL 2-0.5 % OP SOLN
1.0000 [drp] | Freq: Two times a day (BID) | OPHTHALMIC | Status: DC
  Administered 2020-08-30: 1 [drp] via OPHTHALMIC
  Filled 2020-08-30: qty 10

## 2020-08-30 MED ORDER — DORZOLAMIDE HCL-TIMOLOL MAL 2-0.5 % OP SOLN: 1.0000 [drp] | Freq: Three times a day (TID) | OPHTHALMIC | 12 refills | Status: AC

## 2020-08-30 MED ORDER — FLUORESCEIN SODIUM 1 MG OP STRP
1.0000 | ORAL_STRIP | Freq: Once | OPHTHALMIC | Status: DC
  Filled 2020-08-30: qty 1

## 2020-08-30 MED ORDER — LATANOPROST 0.005 % OP SOLN: 1.0000 [drp] | Freq: Every day | OPHTHALMIC | 12 refills | Status: DC

## 2020-08-30 NOTE — ED Provider Notes (Signed)
[facility]-EMERGENCY DEPT Provider Note   CSN: [id number] Arrival date & time: 08/30/20  1625     History Chief Complaint  Patient presents with  . Eye Problem    Edgar [name] is a 60 y.o. male with a history of hepatitis C, and malignant neoplasm of splenic flexure.  Patient presents with a chief complaint of left eye pain and decreased vision, patient reports that his symptoms began yesterday upon waking, pain has remained constant, 9/10 on the pain scale, worse with eye movement, no alleviating factors.  Patient reports that yesterday he was only able to see light.  Today his vision is improved however significantly decreased.  Patient endorses tearing, and erythema to affected eye.    Patient states that he started having trouble with vision in his left eye 6 to 8 months ago after he recovered from COVID-19 infection.  He states that at that time his eye "would not adjust right."  5 weeks prior he reports that the vision in his left eye became worse and described it as "blurry vision."    Patient denies any fevers, chills, headache.   Patient denies any history of hypertension or diabetes.    HPI     Past Medical History:  Diagnosis Date  . Hepatitis C 1990s  . Malignant neoplasm of splenic flexure (HCC)   . Mass of colon 06/2015  . Microcytic anemia 02/2015  . Transfusion history    3 units transfused 06-29-15    Patient Active Problem List   Diagnosis Date Noted  . Diarrhea 12/18/2018  . Chronic diarrhea 12/18/2018  . Blood in stool 12/18/2018  . Sleep disorder 12/18/2018  . Flashing lights seen 11/22/2015  . Periodic limb movement sleep disorder 11/22/2015  . Genetic testing 09/16/2015  . Family history of colon cancer 08/24/2015  . Malignant neoplasm of splenic flexure (HCC)   . Cigarette smoker 06/29/2015  . Anemia due to gastrointestinal blood loss 06/29/2015    Past Surgical History:  Procedure Laterality Date  . COLONOSCOPY  WITH PROPOFOL N/A 06/30/2015   Procedure: COLONOSCOPY WITH PROPOFOL;  Surgeon: Hilarie Fredrickson, MD;  Location: [facility] ENDOSCOPY;  Service: Endoscopy;  Laterality: N/A;  . ESOPHAGOGASTRODUODENOSCOPY (EGD) WITH PROPOFOL N/A 06/30/2015   Procedure: ESOPHAGOGASTRODUODENOSCOPY (EGD) WITH PROPOFOL;  Surgeon: Hilarie Fredrickson, MD;  Location: [facility] ENDOSCOPY;  Service: Endoscopy;  Laterality: N/A;  . LAPAROSCOPIC PARTIAL COLECTOMY N/A 07/16/2015   Procedure: LAPAROSCOPIC TOTAL ABDOMINAL COLECTOMY ;  Surgeon: Romie Levee, MD;  Location: [facility] ORS;  Service: General;  Laterality: N/A;  . Skin graft to repair injury to right thumb.         Family History  Problem Relation Age of Onset  . Other Sister 56       history of hysterectomy due to abdominal pain  . Colon cancer Maternal Uncle        dx. mid-50s  . Colon cancer Paternal Aunt   . Asthma Maternal Grandfather   . Cancer Paternal Grandmother        unspecified  . Diabetes Paternal Grandfather   . Heart Problems Maternal Uncle   . Other Cousin        abdominal issues requiring surgery (male maternal 1st cousin)  . Stomach cancer Neg Hx   . Pancreatic cancer Neg Hx   . Esophageal cancer Neg Hx     Social History   Tobacco Use  . Smoking status: Current Every Day Smoker    Packs/day: 0.25  Years: 30.00    Pack years: 7.50    Types: Cigarettes  . Smokeless tobacco: Never Used  . Tobacco comment: Pack last about a week.  Vaping Use  . Vaping Use: Never used  Substance Use Topics  . Alcohol use: No    Alcohol/week: 0.0 standard drinks    Comment: stopped recently.  . Drug use: Yes    Types: Marijuana, LSD, Cocaine, Methamphetamines    Comment: Quit-age 21's -, Marijuana none in 30 yrs, Cocaine-none in 7-8 yrs.    Home Medications Prior to Admission medications   Medication Sig Start Date End Date Taking? Authorizing Provider  brimonidine (ALPHAGAN) 0.2 % ophthalmic solution Place 1 drop into the left eye 3 (three) times daily. 08/30/20   Yes Loni Beckwith, PA-C  dorzolamide-timolol (COSOPT) 22.3-6.8 MG/ML ophthalmic solution Place 1 drop into the left eye in the morning, at noon, and at bedtime. 08/30/20  Yes Earnestine Tuohey, Rudell Cobb, PA-C  latanoprost (XALATAN) 0.005 % ophthalmic solution Place 1 drop into the left eye at bedtime. 08/30/20  Yes Loni Beckwith, PA-C  ferrous sulfate 325 (65 FE) MG tablet TAKE 1 TABLET (325 MG TOTAL) BY MOUTH 2 (TWO) TIMES DAILY. 10/22/15   Rice, Resa Miner, MD  Multiple Vitamins-Minerals (MULTIVITAMINS) CHEW Chew by mouth.    [provider]  traMADol (ULTRAM) 50 MG tablet Take 1 tablet (50 mg total) by mouth at bedtime as needed. 11/22/15   Norval Gable, MD  traZODone (DESYREL) 50 MG tablet Take 0.5-1 tablets (25-50 mg total) by mouth at bedtime as needed for sleep. 12/18/18   Antony Blackbird, MD    Allergies    Patient has no known allergies.  Review of Systems   Review of Systems  Constitutional: Negative for chills and fever.  Eyes: Positive for pain, discharge (tearing left eye), redness and visual disturbance. Negative for itching.  Genitourinary: Negative for difficulty urinating and dysuria.  Musculoskeletal: Negative for neck pain and neck stiffness.  Skin: Negative for color change and rash.  Neurological: Negative for dizziness, syncope, light-headedness, numbness and headaches.  Psychiatric/Behavioral: Negative for confusion.    Physical Exam Updated Vital Signs BP (!) 135/99   Pulse 78   Temp 98.3 F (36.8 C) (Oral)   Resp 18   Ht 6\' 3"  (1.905 m)   Wt 88.5 kg   SpO2 95%   BMI 24.37 kg/m   Physical Exam Vitals and nursing note reviewed.  Constitutional:      General: He is not in acute distress.    Appearance: He is not ill-appearing, toxic-appearing or diaphoretic.  HENT:     Head: Normocephalic and atraumatic. No right periorbital erythema or left periorbital erythema.  Eyes:     General: Lids are normal. Lids are everted, no foreign bodies  appreciated. No scleral icterus.       Right eye: No foreign body, discharge or hordeolum.        Left eye: Discharge present.No foreign body or hordeolum.     Intraocular pressure: Right eye pressure is 26 mmHg. Left eye pressure is 34 mmHg. Measurements were taken using a handheld tonometer.    Extraocular Movements: Extraocular movements intact.     Conjunctiva/sclera:     Right eye: Right conjunctiva is not injected. No chemosis, exudate or hemorrhage.    Left eye: Left conjunctiva is injected. No chemosis, exudate or hemorrhage.    Pupils: Pupils are unequal.     Right eye: Pupil is round and reactive. No corneal  abrasion or fluorescein uptake.     Left eye: Pupil is not reactive. Pupil is round. No corneal abrasion or fluorescein uptake.     Comments: Left pupil is dilated at 6 mm and fixed, cornea hazy   Cardiovascular:     Rate and Rhythm: Normal rate.  Pulmonary:     Effort: Pulmonary effort is normal.  Skin:    General: Skin is warm and dry.  Neurological:     General: No focal deficit present.     Mental Status: He is alert.  Psychiatric:        Behavior: Behavior is cooperative.     ED Results / Procedures / Treatments   Labs (all labs ordered are listed, but only abnormal results are displayed) Labs Reviewed - No data to display  EKG None  Radiology No results found.  Procedures Procedures   Medications Ordered in ED Medications  tetracaine (PONTOCAINE) 0.5 % ophthalmic solution 2 drop (2 drops Both Eyes Not Given 08/30/20 1839)  fluorescein ophthalmic strip 1 strip (1 strip Both Eyes Not Given 08/30/20 1839)  dorzolamide-timolol (COSOPT) 22.3-6.8 MG/ML ophthalmic solution 1 drop (1 drop Left Eye Given 08/30/20 1917)  tetracaine (PONTOCAINE) 0.5 % ophthalmic solution 2 drop (2 drops Left Eye Given 08/30/20 1839)  fluorescein ophthalmic strip 1 strip (1 strip Left Eye Given 08/30/20 1839)  oxyCODONE-acetaminophen (PERCOCET/ROXICET) 5-325 MG per tablet 1 tablet (1  tablet Oral Given 08/30/20 1848)  brimonidine (ALPHAGAN) 0.15 % ophthalmic solution 1 drop (1 drop Left Eye Given 08/30/20 1916)    ED Course  I have reviewed the triage vital signs and the nursing notes.  Pertinent labs & imaging results that were available during my care of the patient were reviewed by me and considered in my medical decision making (see chart for details).    MDM Rules/Calculators/A&P                          Alert 60 year old male in no acute distress, nontoxic-appearing.  Presents with complaint of decreased vision and pain in left eye.  Patient reports that yesterday he had almost complete vision loss only being able to see light acute onset of pain upon waking up.  Patient reports that the patient has slowly today.    Left pupil is dilated, nonresponsive, hazy cornea, sclera is injected.  EOM intact bilaterally, tearing noted.  Intraocular pressure in left eye 34, right eye 26.  Per nursing note patient unable to read anything on visual acuity chart with his left eye.  Findings concerning for acute angle glaucoma, will consult ophthalmology.  No periorbital erythema or swelling, no fevers or chills.  Less concerning for orbital or periorbital cellulitis.    Kerr spoke with Dr. Quentin Ore.  Expressed concern about having acute angle glaucoma, increased intraocular pressure in affected eye, and severely diminished eyesight in left eye.  Dr. Kathlen Mody recommended starting patient on brimonidine TID, Cospot TID, and Latanoprost once at bedtime.  He will see the patient in the office tomorrow at 07:45.   Will discharge patient with recommended medications.  Patient was informed of his appointment and importance of visit was stressed.  He was warned that without proper follow-up his vision could deteriorate leading to blindness.  Patient expressed understanding.  Discussed results, findings, treatment and follow up. Patient advised of return precautions. Patient verbalized  understanding and agreed with plan.  Patient was discussed with and evaluated by Dr. Ralene Bathe.   Final Clinical  Impression(s) / ED Diagnoses Final diagnoses:  Left eye pain  Decreased vision of left eye    Rx / DC Orders ED Discharge Orders         Ordered    brimonidine (ALPHAGAN) 0.2 % ophthalmic solution  3 times daily        08/30/20 1858    dorzolamide-timolol (COSOPT) 22.3-6.8 MG/ML ophthalmic solution  3 times daily        08/30/20 1858    latanoprost (XALATAN) 0.005 % ophthalmic solution  Daily at bedtime        08/30/20 1858           Dyann Ruddle 08/31/20 0005    Quintella Reichert, MD 09/02/20 [phone]

## 2020-08-30 NOTE — ED Notes (Signed)
Patient was unable to read anything on the visual acuity chart with his left eye. Right eye 20/25

## 2020-08-30 NOTE — Discharge Instructions (Addendum)
You came to the emergency department today to be evaluated for your left eye pain and decreased vision.  Your exam was concerning for glaucoma as the pressure your left eye was elevated.  You were given brimonidine and Cospot eye drops in the emergency department.  You must use put 1 drop of each in your left eye 3 times a day.  You were also prescribed Latanoprost, please place 1 drop in your left eye at bedtime   You have an appointment with the ophthalmologist Dr. Kathlen Mody tomorrow morning at 07:45am.  His office is located at [facility]. for [facility].  It is very important that you make this appointment as your vision may continue to worsen leading to eventual blindness if you are not seen by an ophthalmologist.  Get help right away if: Worsening pain in your affected eye. Worsening problems with your vision. You have a bad headache in the area around your eye. You develop nausea and vomiting. The same or similar symptoms develop in your other eye.

## 2020-08-30 NOTE — ED Triage Notes (Signed)
Patient reports his left eye completely has lost vision and he cannot see anything but light. Patient reports he can not make out shapes, only light

## 2023-09-05 ENCOUNTER — Encounter: Payer: Self-pay | Admitting: Physician Assistant

## 2023-10-19 ENCOUNTER — Other Ambulatory Visit

## 2023-10-19 ENCOUNTER — Encounter: Payer: Self-pay | Admitting: Physician Assistant

## 2023-10-19 ENCOUNTER — Ambulatory Visit: Payer: Self-pay | Admitting: Physician Assistant

## 2023-10-19 VITALS — BP 134/88 | HR 77 | Ht 75.0 in | Wt 190.6 lb

## 2023-10-19 DIAGNOSIS — K625 Hemorrhage of anus and rectum: Secondary | ICD-10-CM | POA: Diagnosis not present

## 2023-10-19 DIAGNOSIS — R197 Diarrhea, unspecified: Secondary | ICD-10-CM | POA: Diagnosis not present

## 2023-10-19 DIAGNOSIS — F17210 Nicotine dependence, cigarettes, uncomplicated: Secondary | ICD-10-CM

## 2023-10-19 DIAGNOSIS — R5383 Other fatigue: Secondary | ICD-10-CM

## 2023-10-19 DIAGNOSIS — Z85038 Personal history of other malignant neoplasm of large intestine: Secondary | ICD-10-CM

## 2023-10-19 LAB — CBC WITH DIFFERENTIAL/PLATELET
Basophils Absolute: 0 10*3/uL (ref 0.0–0.1)
Basophils Relative: 0.6 % (ref 0.0–3.0)
Eosinophils Absolute: 0.1 10*3/uL (ref 0.0–0.7)
Eosinophils Relative: 1.1 % (ref 0.0–5.0)
HCT: 38.8 % — ABNORMAL LOW (ref 39.0–52.0)
Hemoglobin: 12.9 g/dL — ABNORMAL LOW (ref 13.0–17.0)
Lymphocytes Relative: 20.7 % (ref 12.0–46.0)
Lymphs Abs: 1.3 10*3/uL (ref 0.7–4.0)
MCHC: 33.2 g/dL (ref 30.0–36.0)
MCV: 94.9 fl (ref 78.0–100.0)
Monocytes Absolute: 0.5 10*3/uL (ref 0.1–1.0)
Monocytes Relative: 7.8 % (ref 3.0–12.0)
Neutro Abs: 4.5 10*3/uL (ref 1.4–7.7)
Neutrophils Relative %: 69.8 % (ref 43.0–77.0)
Platelets: 284 10*3/uL (ref 150.0–400.0)
RBC: 4.09 Mil/uL — ABNORMAL LOW (ref 4.22–5.81)
RDW: 13.7 % (ref 11.5–15.5)
WBC: 6.5 10*3/uL (ref 4.0–10.5)

## 2023-10-19 LAB — COMPREHENSIVE METABOLIC PANEL
ALT: 36 U/L (ref 0–53)
AST: 27 U/L (ref 0–37)
Albumin: 4.7 g/dL (ref 3.5–5.2)
Alkaline Phosphatase: 54 U/L (ref 39–117)
BUN: 13 mg/dL (ref 6–23)
CO2: 24 meq/L (ref 19–32)
Calcium: 9.5 mg/dL (ref 8.4–10.5)
Chloride: 105 meq/L (ref 96–112)
Creatinine, Ser: 1.17 mg/dL (ref 0.40–1.50)
GFR: 66.69 mL/min (ref >60.00)
Glucose, Bld: 95 mg/dL (ref 70–99)
Potassium: 3.8 meq/L (ref 3.5–5.1)
Sodium: 139 meq/L (ref 135–145)
Total Bilirubin: 0.8 mg/dL (ref 0.2–1.2)
Total Protein: 7.4 g/dL (ref 6.0–8.3)

## 2023-10-19 LAB — B12 AND FOLATE PANEL
Folate: 16.7 ng/mL (ref >5.9)
Vitamin B-12: 184 pg/mL — ABNORMAL LOW (ref 211–911)

## 2023-10-19 LAB — VITAMIN D 25 HYDROXY (VIT D DEFICIENCY, FRACTURES): VITD: 18.76 ng/mL — ABNORMAL LOW (ref 30.00–100.00)

## 2023-10-19 LAB — IBC + FERRITIN
Ferritin: 88.1 ng/mL (ref 22.0–322.0)
Iron: 137 ug/dL (ref 42–165)
Saturation Ratios: 34.3 % (ref 20.0–50.0)
TIBC: 399 ug/dL (ref 250.0–450.0)
Transferrin: 285 mg/dL (ref 212.0–360.0)

## 2023-10-19 MED ORDER — HYDROCORTISONE ACETATE 25 MG RE SUPP: 25.0000 mg | Freq: Two times a day (BID) | RECTAL | 1 refills | Status: DC

## 2023-10-19 NOTE — Progress Notes (Signed)
 Chief Complaint: Rectal Bleeding and Diarrhea  HPI:     Edgar [name] is a 63 year old African-American male with a past medical history as listed below including history of left carotid artery stenosis status post endarterectomy in 2022, colon cancer, known to Dr. Marina Goodell, who was referred to me by [name], MD for a complaint of diarrhea and rectal bleeding.    05/30/2019 colonoscopy done for personal history of colon cancer in 2016 status post subtotal colectomy, also with chronic postoperative diarrhea and minor rectal bleeding.  At that time normal residual colon, ileum and ileocolonic anastomosis, status post subtotal colectomy, no neoplasia.  Diarrhea secondary to altered anatomy.  Recommended Metamucil 2 tablespoons daily in 12 ounces of water or juice.  Also discussed Imodium 1 to 2 tablets 3 times daily as needed for diarrhea.  Repeat colonoscopy in 5 years.    07/13/2023 CMP normal, ESR normal, CRP normal, CBC with a hemoglobin of 13.7 and otherwise normal.  TSH normal.  Hemoglobin A1c 5.5.    Today, the patient presents to clinic and tells me that he continues with diarrhea and rectal bleeding.  This is unchanged since prior to his colonoscopy before and after his partial colectomy.  Describes he never did the fiber supplement or Imodium because he thought this was for hemorrhoids and he did not think he was having an issue with that.  Describes that he often has urgent loose stools that are hard to hold.  Sometimes 3-7 times a day depending on what he is eating.  Sometimes altering his diet can help a little bit.  Also has bright red rectal bleeding sporadically just as he did before.  Colonoscopy noted internal hemorrhoids.  Patient tells me he has not had bleeding though now for a few weeks.  Does think his symptoms have maybe gotten a little bit worse since October of last year when his dad passed away and he was put in charge of taking care of his mom.  Tells me he is not able to go out  and about and do the things he used to.  He feels very low energy.  Tells me that was almost how he felt when he had colon cancer.  It is not that bad though right now.  Biggest complaint is really lack of energy.  Other symptoms are similar.    Denies fever, chills or weight loss.  Past Medical History:  Diagnosis Date   Hepatitis C 1990s   Malignant neoplasm of splenic flexure (HCC)    Mass of colon 06/2015   Microcytic anemia 02/2015   Transfusion history    3 units transfused 06-29-15    Past Surgical History:  Procedure Laterality Date   COLONOSCOPY WITH PROPOFOL N/A 06/30/2015   Procedure: COLONOSCOPY WITH PROPOFOL;  Surgeon: Hilarie Fredrickson, MD;  Location: [facility] ENDOSCOPY;  Service: Endoscopy;  Laterality: N/A;   ESOPHAGOGASTRODUODENOSCOPY (EGD) WITH PROPOFOL N/A 06/30/2015   Procedure: ESOPHAGOGASTRODUODENOSCOPY (EGD) WITH PROPOFOL;  Surgeon: Hilarie Fredrickson, MD;  Location: [facility] ENDOSCOPY;  Service: Endoscopy;  Laterality: N/A;   LAPAROSCOPIC PARTIAL COLECTOMY N/A 07/16/2015   Procedure: LAPAROSCOPIC TOTAL ABDOMINAL COLECTOMY ;  Surgeon: Romie Levee, MD;  Location: [facility] ORS;  Service: General;  Laterality: N/A;   Skin graft to repair injury to right thumb.      Current Outpatient Medications  Medication Sig Dispense Refill   aspirin EC 81 MG tablet Take 1 tablet by mouth daily.     atorvastatin (LIPITOR) 80 MG tablet Take  1 tablet by mouth daily.     Multiple Vitamin (MULTI-VITAMIN) tablet Take 1 tablet by mouth daily.     dorzolamide-timolol (COSOPT) 22.3-6.8 MG/ML ophthalmic solution Place 1 drop into the left eye in the morning, at noon, and at bedtime. 10 mL 12   ferrous sulfate 325 (65 FE) MG tablet TAKE 1 TABLET (325 MG TOTAL) BY MOUTH 2 (TWO) TIMES DAILY. 60 tablet 0   traMADol (ULTRAM) 50 MG tablet Take 1 tablet (50 mg total) by mouth at bedtime as needed. 30 tablet 0   No current facility-administered medications for this visit.    Allergies as of 10/19/2023   (No Known  Allergies)    Family History  Problem Relation Age of Onset   Other Sister 69       history of hysterectomy due to abdominal pain   Colon cancer Maternal Uncle        dx. mid-50s   Colon cancer Paternal Aunt    Asthma Maternal Grandfather    Cancer Paternal Grandmother        unspecified   Diabetes Paternal Grandfather    Heart Problems Maternal Uncle    Other Cousin        abdominal issues requiring surgery (male maternal 1st cousin)   Stomach cancer Neg Hx    Pancreatic cancer Neg Hx    Esophageal cancer Neg Hx     Social History   Socioeconomic History   Marital status: Single    Spouse name: Not on file   Number of children: Not on file   Years of education: Not on file   Highest education level: Not on file  Occupational History   Occupation: Journalist, newspaper  Tobacco Use   Smoking status: Every Day    Current packs/day: 0.25    Average packs/day: 0.3 packs/day for 30.0 years (7.5 ttl pk-yrs)    Types: Cigarettes   Smokeless tobacco: Never   Tobacco comments:    Pack last about a week.  Vaping Use   Vaping status: Never Used  Substance and Sexual Activity   Alcohol use: No    Alcohol/week: 0.0 standard drinks of alcohol    Comment: stopped recently.   Drug use: Yes    Types: Marijuana, LSD, Cocaine, Methamphetamines    Comment: Quit-age 61's -, Marijuana none in 30 yrs, Cocaine-none in 7-8 yrs.   Sexual activity: Not on file  Other Topics Concern   Not on file  Social History Narrative   Not on file   Social Drivers of Health   Financial Resource Strain: Not on file  Food Insecurity: Not on file  Transportation Needs: Not on file  Physical Activity: Not on file  Stress: Not on file  Social Connections: Not on file  Intimate Partner Violence: Not on file    Review of Systems:    Constitutional: No weight loss, fever pr chills Skin: No rash or itching Cardiovascular: No chest pain, chest pressure or palpitations   Respiratory: No SOB or  cough Gastrointestinal: See HPI and otherwise negative Genitourinary: No dysuria or change in urinary frequency Neurological: No headache, dizziness or syncope Musculoskeletal: No new muscle or joint pain Hematologic: No bruising Psychiatric: No history of depression or anxiety   Physical Exam:  Vital signs: BP 134/88 (BP Location: Left Arm, Patient Position: Sitting, Cuff Size: Normal)   Pulse 77   Ht 6\' 3"  (1.905 m)   Wt 190 lb 9.6 oz (86.5 kg)   BMI 23.82 kg/m  Constitutional:   Pleasant AA male appears to be in NAD, Well developed, Well nourished, alert and cooperative Head:  Normocephalic and atraumatic. 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 Neck:  Supple Throat: Oral cavity and pharynx without inflammation, swelling or lesion.  Respiratory: Respirations even and unlabored. Lungs clear to auscultation bilaterally.   No wheezes, crackles, or rhonchi.  Cardiovascular: Normal S1, S2. No MRG. Regular rate and rhythm. No peripheral edema, cyanosis or pallor.  Gastrointestinal:  Soft, nondistended, nontender. No rebound or guarding. Normal bowel sounds. No appreciable masses or hepatomegaly. Rectal:  Declined Msk:  Symmetrical without gross deformities. Without edema, no deformity or joint abnormality.  Neurologic:  Alert and  oriented x4;  grossly normal neurologically.  Skin:   Dry and intact without significant lesions or rashes. Psychiatric: Demonstrates good judgement and reason without abnormal affect or behaviors.  RELEVANT LABS AND IMAGING: CBC    Component Value Date/Time   WBC 6.9 01/08/2019 1414   WBC 9.1 07/21/2015 0438   RBC 4.18 01/08/2019 1414   RBC 3.64 (L) 07/21/2015 0438   HGB 13.2 01/08/2019 1414   HCT 40.2 01/08/2019 1414   PLT 238 01/08/2019 1414   MCV 96 01/08/2019 1414   MCH 31.6 01/08/2019 1414   MCH 21.7 (L) 07/21/2015 0438   MCHC 32.8 01/08/2019 1414   MCHC 32.0 07/21/2015 0438   RDW 14.4 01/08/2019 1414   LYMPHSABS 1.5  01/08/2019 1414   EOSABS 0.1 01/08/2019 1414   BASOSABS 0.1 01/08/2019 1414    CMP     Component Value Date/Time   NA 141 01/08/2019 1414   K 4.1 01/08/2019 1414   CL 103 01/08/2019 1414   CO2 22 01/08/2019 1414   GLUCOSE 107 (H) 01/08/2019 1414   GLUCOSE 103 (H) 11/22/2015 1018   BUN 14 01/08/2019 1414   CREATININE 1.25 01/08/2019 1414   CALCIUM 10.0 01/08/2019 1414   PROT 7.1 01/08/2019 1414   ALBUMIN 4.6 01/08/2019 1414   AST 23 01/08/2019 1414   ALT 31 01/08/2019 1414   ALKPHOS 64 01/08/2019 1414   BILITOT 0.7 01/08/2019 1414   GFRNONAA 64 01/08/2019 1414   GFRAA 73 01/08/2019 1414    Assessment: 1.  Diarrhea: Ever since partial colectomy, has never tried a fiber supplement, colonoscopy in 2020; likely due to altered anatomy 2.  Rectal bleeding: Ever since partial colectomy, found hemorrhoids on colonoscopy in 2020 thought to be the source, has not worsened lately; likely hemorrhoids, patient declined rectal exam 3.  History of colon cancer: Due for repeat colonoscopy in October of this year 4.  Fatigue: Likely multifactorial, will do some lab work  Plan: 1.  Prescribed Hydrocortisone suppositories twice daily x 7 days #14 with 1 refill.  Discussed with patient that if he has return of bleeding or hemorrhoid symptoms would recommend he trial this. 2.  Offered multiple times to do rectal exam the patient declined.  Tells me "you probably would not see anything because it has not happened in a while" 3.  Discussed with patient that he is due for repeat colonoscopy in October.  If symptoms get worse before then we could discuss doing it earlier.  Made sure that he was in recall. 4.  Will check labs today given fatigue including iron studies, B12, CBC, CMP and vitamin D 5.  Recommend the patient start a fiber supplement 1 to 2 tablespoons of Metamucil, Citrucel or Benefiber daily to help solidify stools.  We discussed exactly how  this would help. 6.  Also recommended Imodium,  1-2 tabs up to 3 times a day.  Could also use them preventatively before going out for something. 7.  Patient to follow in clinic after labs below.  Edgar Meeker, PA-C [facility] 10/19/2023, 11:17 AM  Cc: [name], MD

## 2023-10-19 NOTE — Progress Notes (Signed)
 Noted.

## 2023-10-19 NOTE — Patient Instructions (Signed)
 Your provider has requested that you go to the basement level for lab work before leaving today. Press "B" on the elevator. The lab is located at the first door on the left as you exit the elevator.  We have sent the following medications to your pharmacy for you to pick up at your convenience: Hydrocortisone Suppository twice daily for 7 days   _______________________________________________________  If your blood pressure at your visit was 140/90 or greater, please contact your primary care physician to follow up on this.  _______________________________________________________  If you are age 5 or older, your body mass index should be between 23-30. Your Body mass index is 23.82 kg/m. If this is out of the aforementioned range listed, please consider follow up with your Primary Care Provider.  If you are age 82 or younger, your body mass index should be between 19-25. Your Body mass index is 23.82 kg/m. If this is out of the aformentioned range listed, please consider follow up with your Primary Care Provider.   ________________________________________________________  The [facility] providers would like to encourage you to use [facility] to communicate with providers for non-urgent requests or questions.  Due to long hold times on the telephone, sending your provider a message by [facility] may be a faster and more efficient way to get a response.  Please allow 48 business hours for a response.  Please remember that this is for non-urgent requests.  _______________________________________________________

## 2024-06-06 ENCOUNTER — Ambulatory Visit

## 2024-06-06 VITALS — Ht 75.0 in | Wt 195.0 lb

## 2024-06-06 DIAGNOSIS — Z85038 Personal history of other malignant neoplasm of large intestine: Secondary | ICD-10-CM

## 2024-06-06 MED ORDER — NA SULFATE-K SULFATE-MG SULF 17.5-3.13-1.6 GM/177ML PO SOLN: 1.0000 | Freq: Once | ORAL | 0 refills | Status: AC

## 2024-06-06 NOTE — Progress Notes (Signed)

## 2024-06-19 ENCOUNTER — Telehealth: Payer: Self-pay | Admitting: Internal Medicine

## 2024-06-19 NOTE — Telephone Encounter (Signed)
 Patient requesting additional f/u call in regards to prep.

## 2024-06-19 NOTE — Telephone Encounter (Signed)
 Spoke with patient - all questions answered

## 2024-06-19 NOTE — Telephone Encounter (Signed)
 Spoke with patient. Prep instructions reviewed.

## 2024-06-19 NOTE — Telephone Encounter (Signed)
 PT is asking a nurse to please call and explain his instructions to him since he cannot get in his MyChart. Please advise.

## 2024-06-20 ENCOUNTER — Encounter: Payer: Self-pay | Admitting: Internal Medicine

## 2024-06-20 ENCOUNTER — Ambulatory Visit (AMBULATORY_SURGERY_CENTER): Admitting: Internal Medicine

## 2024-06-20 VITALS — BP 115/70 | HR 82 | Temp 97.5°F | Resp 15 | Ht 75.0 in | Wt 195.0 lb

## 2024-06-20 DIAGNOSIS — Z85038 Personal history of other malignant neoplasm of large intestine: Secondary | ICD-10-CM | POA: Diagnosis not present

## 2024-06-20 DIAGNOSIS — Z9049 Acquired absence of other specified parts of digestive tract: Secondary | ICD-10-CM | POA: Diagnosis not present

## 2024-06-20 DIAGNOSIS — Z1211 Encounter for screening for malignant neoplasm of colon: Secondary | ICD-10-CM

## 2024-06-20 DIAGNOSIS — Z980 Intestinal bypass and anastomosis status: Secondary | ICD-10-CM

## 2024-06-20 DIAGNOSIS — K648 Other hemorrhoids: Secondary | ICD-10-CM | POA: Diagnosis not present

## 2024-06-20 DIAGNOSIS — K625 Hemorrhage of anus and rectum: Secondary | ICD-10-CM

## 2024-06-20 MED ORDER — SODIUM CHLORIDE 0.9 % IV SOLN: 500.0000 mL | INTRAVENOUS | Status: DC

## 2024-06-20 NOTE — Progress Notes (Signed)
 Pt went to hospital 11/07 for vision problems. CT scan revealed something concerning at top of lung and now he has fu appointment with pulmonologist. He also started taking Plavix again. His last dose was 06/18/24. He drank 8 oz water at 1330 today. MD and CRNA made aware, and both are Ok for pt to have colonoscopy today.

## 2024-06-20 NOTE — Progress Notes (Signed)
 Expand All Collapse All    Chief Complaint: Rectal Bleeding and Diarrhea   HPI:     Edgar [name] is a 63 year old African-American male with a past medical history as listed below including history of left carotid artery stenosis status post endarterectomy in 2022, colon cancer, known to Dr. Abran, who was referred to me by Edgar House, MD for a complaint of diarrhea and rectal bleeding.    05/30/2019 colonoscopy done for personal history of colon cancer in 2016 status post subtotal colectomy, also with chronic postoperative diarrhea and minor rectal bleeding.  At that time normal residual colon, ileum and ileocolonic anastomosis, status post subtotal colectomy, no neoplasia.  Diarrhea secondary to altered anatomy.  Recommended Metamucil 2 tablespoons daily in 12 ounces of water or juice.  Also discussed Imodium 1 to 2 tablets 3 times daily as needed for diarrhea.  Repeat colonoscopy in 5 years.    07/13/2023 CMP normal, ESR normal, CRP normal, CBC with a hemoglobin of 13.7 and otherwise normal.  TSH normal.  Hemoglobin A1c 5.5.    Today, the patient presents to clinic and tells me that he continues with diarrhea and rectal bleeding.  This is unchanged since prior to his colonoscopy before and after his partial colectomy.  Describes he never did the fiber supplement or Imodium because he thought this was for hemorrhoids and he did not think he was having an issue with that.  Describes that he often has urgent loose stools that are hard to hold.  Sometimes 3-7 times a day depending on what he is eating.  Sometimes altering his diet can help a little bit.  Also has bright red rectal bleeding sporadically just as he did before.  Colonoscopy noted internal hemorrhoids.  Patient tells me he has not had bleeding though now for a few weeks.  Does think his symptoms have maybe gotten a little bit worse since October of last year when his dad passed away and he was put in charge of taking care of his mom.  Tells me  he is not able to go out and about and do the things he used to.  He feels very low energy.  Tells me that was almost how he felt when he had colon cancer.  It is not that bad though right now.  Biggest complaint is really lack of energy.  Other symptoms are similar.    Denies fever, chills or weight loss.       Past Medical History:  Diagnosis Date   Hepatitis C 1990s   Malignant neoplasm of splenic flexure (HCC)     Mass of colon 06/2015   Microcytic anemia 02/2015   Transfusion history      3 units transfused 06-29-15               Past Surgical History:  Procedure Laterality Date   COLONOSCOPY WITH PROPOFOL  N/A 06/30/2015    Procedure: COLONOSCOPY WITH PROPOFOL ;  Surgeon: Edgar LOISE Abran, MD;  Location: [facility] ENDOSCOPY;  Service: Endoscopy;  Laterality: N/A;   ESOPHAGOGASTRODUODENOSCOPY (EGD) WITH PROPOFOL  N/A 06/30/2015    Procedure: ESOPHAGOGASTRODUODENOSCOPY (EGD) WITH PROPOFOL ;  Surgeon: Edgar LOISE Abran, MD;  Location: [facility] ENDOSCOPY;  Service: Endoscopy;  Laterality: N/A;   LAPAROSCOPIC PARTIAL COLECTOMY N/A 07/16/2015    Procedure: LAPAROSCOPIC TOTAL ABDOMINAL COLECTOMY ;  Surgeon: Edgar Ned, MD;  Location: [facility] ORS;  Service: General;  Laterality: N/A;   Skin graft to repair injury to right thumb.  Current Outpatient Medications  Medication Sig Dispense Refill   aspirin EC 81 MG tablet Take 1 tablet by mouth daily.       atorvastatin (LIPITOR) 80 MG tablet Take 1 tablet by mouth daily.       Multiple Vitamin (MULTI-VITAMIN) tablet Take 1 tablet by mouth daily.       dorzolamide -timolol  (COSOPT ) 22.3-6.8 MG/ML ophthalmic solution Place 1 drop into the left eye in the morning, at noon, and at bedtime. 10 mL 12   ferrous sulfate  325 (65 FE) MG tablet TAKE 1 TABLET (325 MG TOTAL) BY MOUTH 2 (TWO) TIMES DAILY. 60 tablet 0   traMADol  (ULTRAM ) 50 MG tablet Take 1 tablet (50 mg total) by mouth at bedtime as needed. 30 tablet 0      No current facility-administered  medications for this visit.           Allergies as of 10/19/2023   (No Known Allergies)           Family History  Problem Relation Age of Onset   Other Sister 72        history of hysterectomy due to abdominal pain   Colon cancer Maternal Uncle          dx. mid-50s   Colon cancer Paternal Aunt     Asthma Maternal Grandfather     Cancer Paternal Grandmother          unspecified   Diabetes Paternal Grandfather     Heart Problems Maternal Uncle     Other Cousin          abdominal issues requiring surgery (male maternal 1st cousin)   Stomach cancer Neg Hx     Pancreatic cancer Neg Hx     Esophageal cancer Neg Hx            Social History         Socioeconomic History   Marital status: Single      Spouse name: Not on file   Number of children: Not on file   Years of education: Not on file   Highest education level: Not on file  Occupational History   Occupation: Journalist, newspaper  Tobacco Use   Smoking status: Every Day      Current packs/day: 0.25      Average packs/day: 0.3 packs/day for 30.0 years (7.5 ttl pk-yrs)      Types: Cigarettes   Smokeless tobacco: Never   Tobacco comments:      Pack last about a week.  Vaping Use   Vaping status: Never Used  Substance and Sexual Activity   Alcohol use: No      Alcohol/week: 0.0 standard drinks of alcohol      Comment: stopped recently.   Drug use: Yes      Types: Marijuana, LSD, Cocaine, Methamphetamines      Comment: Quit-age 28's -, Marijuana none in 30 yrs, Cocaine-none in 7-8 yrs.   Sexual activity: Not on file  Other Topics Concern   Not on file  Social History Narrative   Not on file    Social Drivers of Health    Financial Resource Strain: Not on file  Food Insecurity: Not on file  Transportation Needs: Not on file  Physical Activity: Not on file  Stress: Not on file  Social Connections: Not on file  Intimate Partner Violence: Not on file      Review of Systems:    Constitutional: No weight  loss, fever pr chills Skin:  No rash or itching Cardiovascular: No chest pain, chest pressure or palpitations   Respiratory: No SOB or cough Gastrointestinal: See HPI and otherwise negative Genitourinary: No dysuria or change in urinary frequency Neurological: No headache, dizziness or syncope Musculoskeletal: No new muscle or joint pain Hematologic: No bruising Psychiatric: No history of depression or anxiety    Physical Exam:  Vital signs: BP 134/88 (BP Location: Left Arm, Patient Position: Sitting, Cuff Size: Normal)   Pulse 77   Ht 6' 3 (1.905 m)   Wt 190 lb 9.6 oz (86.5 kg)   BMI 23.82 kg/m     Constitutional:   Pleasant AA male appears to be in NAD, Well developed, Well nourished, alert and cooperative Head:  Normocephalic and atraumatic. 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 Neck:  Supple Throat: Oral cavity and pharynx without inflammation, swelling or lesion.  Respiratory: Respirations even and unlabored. Lungs clear to auscultation bilaterally.   No wheezes, crackles, or rhonchi.  Cardiovascular: Normal S1, S2. No MRG. Regular rate and rhythm. No peripheral edema, cyanosis or pallor.  Gastrointestinal:  Soft, nondistended, nontender. No rebound or guarding. Normal bowel sounds. No appreciable masses or hepatomegaly. Rectal:  Declined Msk:  Symmetrical without gross deformities. Without edema, no deformity or joint abnormality.  Neurologic:  Alert and  oriented x4;  grossly normal neurologically.  Skin:   Dry and intact without significant lesions or rashes. Psychiatric: Demonstrates good judgement and reason without abnormal affect or behaviors.   RELEVANT LABS AND IMAGING: CBC Labs (Brief)          Component Value Date/Time    WBC 6.9 01/08/2019 1414    WBC 9.1 07/21/2015 0438    RBC 4.18 01/08/2019 1414    RBC 3.64 (L) 07/21/2015 0438    HGB 13.2 01/08/2019 1414    HCT 40.2 01/08/2019 1414    PLT 238 01/08/2019 1414    MCV 96 01/08/2019  1414    MCH 31.6 01/08/2019 1414    MCH 21.7 (L) 07/21/2015 0438    MCHC 32.8 01/08/2019 1414    MCHC 32.0 07/21/2015 0438    RDW 14.4 01/08/2019 1414    LYMPHSABS 1.5 01/08/2019 1414    EOSABS 0.1 01/08/2019 1414    BASOSABS 0.1 01/08/2019 1414        CMP     Labs (Brief)          Component Value Date/Time    NA 141 01/08/2019 1414    K 4.1 01/08/2019 1414    CL 103 01/08/2019 1414    CO2 22 01/08/2019 1414    GLUCOSE 107 (H) 01/08/2019 1414    GLUCOSE 103 (H) 11/22/2015 1018    BUN 14 01/08/2019 1414    CREATININE 1.25 01/08/2019 1414    CALCIUM 10.0 01/08/2019 1414    PROT 7.1 01/08/2019 1414    ALBUMIN 4.6 01/08/2019 1414    AST 23 01/08/2019 1414    ALT 31 01/08/2019 1414    ALKPHOS 64 01/08/2019 1414    BILITOT 0.7 01/08/2019 1414    GFRNONAA 64 01/08/2019 1414    GFRAA 73 01/08/2019 1414        Assessment: 1.  Diarrhea: Ever since partial colectomy, has never tried a fiber supplement, colonoscopy in 2020; likely due to altered anatomy 2.  Rectal bleeding: Ever since partial colectomy, found hemorrhoids on colonoscopy in 2020 thought to be the source, has not worsened lately; likely hemorrhoids, patient declined rectal exam 3.  History  of colon cancer: Due for repeat colonoscopy in October of this year 4.  Fatigue: Likely multifactorial, will do some lab work   Plan: 1.  Prescribed Hydrocortisone  suppositories twice daily x 7 days #14 with 1 refill.  Discussed with patient that if he has return of bleeding or hemorrhoid symptoms would recommend he trial this. 2.  Offered multiple times to do rectal exam the patient declined.  Tells me you probably would not see anything because it has not happened in a while 3.  Discussed with patient that he is due for repeat colonoscopy in October.  If symptoms get worse before then we could discuss doing it earlier.  Made sure that he was in recall. 4.  Will check labs today given fatigue including iron studies, B12, CBC, CMP  and vitamin D  5.  Recommend the patient start a fiber supplement 1 to 2 tablespoons of Metamucil, Citrucel or Benefiber daily to help solidify stools.  We discussed exactly how this would help. 6.  Also recommended Imodium, 1-2 tabs up to 3 times a day.  Could also use them preventatively before going out for something. 7.  Patient to follow in clinic after labs below.   Edgar Failing, PA-C [facility] 10/19/2023, 11:17 AM   Cc: Edgar House, MD     Most recent office evaluation as above.  No interval significant GI clinical changes.  Recent ER evaluation for visual disturbance noted.  Physical exam today unremarkable.  Now for colonoscopy due to history of colon cancer status post subtotal colectomy 2016.  Last examination 2020 with 20 cm of residual colon.  That examination was negative.

## 2024-06-20 NOTE — Patient Instructions (Signed)

## 2024-06-20 NOTE — Op Note (Signed)
 [facility] Patient Name: Edgar [name] Procedure Date: 06/20/2024 3:51 PM MRN: [id number] Endoscopist: Norleen SAILOR. Abran , MD, [id number] Age: 63 Referring MD:  Date of Birth: Nov 03, 1960 Gender: Male Account #: [id number] Procedure:                Colonoscopy Indications:              High risk colon cancer surveillance: Personal                            history of colon cancer 2016 status post subtotal                            colectomy. Last examination 2020 was negative for                            neoplasia Medicines:                Monitored Anesthesia Care Procedure:                Pre-Anesthesia Assessment:                           - Prior to the procedure, a History and Physical                            was performed, and patient medications and                            allergies were reviewed. The patient's tolerance of                            previous anesthesia was also reviewed. The risks                            and benefits of the procedure and the sedation                            options and risks were discussed with the patient.                            All questions were answered, and informed consent                            was obtained. Prior Anticoagulants: The patient has                            taken Plavix (clopidogrel), last dose was 2 days                            prior to procedure. ASA Grade Assessment: III - A                            patient with severe systemic disease. After  reviewing the risks and benefits, the patient was                            deemed in satisfactory condition to undergo the                            procedure.                           After obtaining informed consent, the colonoscope                            was passed under direct vision. Throughout the                            procedure, the patient's blood pressure, pulse, and                             oxygen saturations were monitored continuously. The                            CF [id number] #[id number] was introduced through the anus                            and advanced to the the ileocolonic anastomosis.                            The rectum was photographed. The quality of the                            bowel preparation was excellent. The colonoscopy                            was performed without difficulty. The patient                            tolerated the procedure well. The bowel preparation                            used was SUPREP via split dose instruction. Scope In: 4:03:36 PM Scope Out: 4:06:22 PM Total Procedure Duration: 0 hours 2 minutes 46 seconds  Findings:                 There was evidence of subtotal colectomy. The                            anastomosis was unremarkable. Located approximately                            20 cm from the anal verge. The entire examined                            colon appeared normal on direct and retroflexion  views. Complications:            No immediate complications. Estimated blood loss:                            None. Estimated Blood Loss:     Estimated blood loss: none. Impression:               - The entire examined colon is normal on direct and                            retroflexion views.                           - Status post subtotal colectomy. Small internal                            hemorrhoids. Recommendation:           - Repeat colonoscopy in 5 years for surveillance.                           - Patient has a contact number available for                            emergencies. The signs and symptoms of potential                            delayed complications were discussed with the                            patient. Return to normal activities tomorrow.                            Written discharge instructions were provided to the                            patient.                            - Resume previous diet.                           - Continue present medications. Norleen SAILOR. Abran, MD 06/20/2024 4:12:17 PM This report has been signed electronically.

## 2024-06-20 NOTE — Progress Notes (Signed)
 Pt sedate, gd SR's, VSS, report to RN

## 2024-06-23 ENCOUNTER — Telehealth: Payer: Self-pay | Admitting: Lactation Services

## 2024-06-23 NOTE — Telephone Encounter (Signed)
  Follow up Call-     06/20/2024    2:48 PM  Call back number  Post procedure Call Back phone  # [phone]  Permission to leave phone message Yes     Patient questions:  Do you have a fever, pain , or abdominal swelling? No. Pain Score  0 *  Have you tolerated food without any problems? Yes.    Have you been able to return to your normal activities? Yes.    Do you have any questions about your discharge instructions: Diet   No. Medications  No. Follow up visit  No.  Do you have questions or concerns about your Care? No.  Actions: * If pain score is 4 or above: No action needed, pain <4.
